# Patient Record
Sex: Male | Born: 1990 | Race: Black or African American | Hispanic: No | Marital: Single | Smoking: Current every day smoker
Health system: Southern US, Community
[De-identification: ages and names within clinical notes are randomized; demographics above are authoritative.]

## PROBLEM LIST (undated history)

## (undated) DIAGNOSIS — Z9889 Other specified postprocedural states: Secondary | ICD-10-CM

## (undated) DIAGNOSIS — R112 Nausea with vomiting, unspecified: Secondary | ICD-10-CM

## (undated) DIAGNOSIS — K219 Gastro-esophageal reflux disease without esophagitis: Secondary | ICD-10-CM

## (undated) HISTORY — PX: OTHER SURGICAL HISTORY: SHX169

---
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
## 2011-09-29 LAB — GC/CHLAMYDIA PROBE AMP, GENITAL: GC Probe Amp, Genital: NEGATIVE

## 2017-01-14 ENCOUNTER — Encounter (HOSPITAL_COMMUNITY): Payer: Self-pay

## 2017-01-14 ENCOUNTER — Emergency Department (HOSPITAL_COMMUNITY)
Admission: EM | Admit: 2017-01-14 | Discharge: 2017-01-15 | Disposition: A | Discharge status: Home / Self Care | Payer: Self-pay | Attending: Emergency Medicine | Admitting: Emergency Medicine

## 2017-01-14 DIAGNOSIS — Z79899 Other long term (current) drug therapy: Secondary | ICD-10-CM | POA: Insufficient documentation

## 2017-01-14 DIAGNOSIS — R112 Nausea with vomiting, unspecified: Secondary | ICD-10-CM | POA: Insufficient documentation

## 2017-01-14 DIAGNOSIS — R197 Diarrhea, unspecified: Secondary | ICD-10-CM | POA: Insufficient documentation

## 2017-01-14 DIAGNOSIS — F17200 Nicotine dependence, unspecified, uncomplicated: Secondary | ICD-10-CM | POA: Insufficient documentation

## 2017-01-14 LAB — CBC
HCT: 45.6 % (ref 39.0–52.0)
Hemoglobin: 15.1 g/dL (ref 13.0–17.0)
MCH: 28.2 pg (ref 26.0–34.0)
MCHC: 33.1 g/dL (ref 30.0–36.0)
MCV: 85.2 fL (ref 78.0–100.0)
PLATELETS: 259 10*3/uL (ref 150–400)
RBC: 5.35 MIL/uL (ref 4.22–5.81)
RDW: 13.1 % (ref 11.5–15.5)
WBC: 6.8 10*3/uL (ref 4.0–10.5)

## 2017-01-14 LAB — COMPREHENSIVE METABOLIC PANEL
ALBUMIN: 3.9 g/dL (ref 3.5–5.0)
ALK PHOS: 62 U/L (ref 38–126)
ALT: 31 U/L (ref 17–63)
AST: 25 U/L (ref 15–41)
Anion gap: 11 (ref 5–15)
BUN: 12 mg/dL (ref 6–20)
CALCIUM: 8.9 mg/dL (ref 8.9–10.3)
CHLORIDE: 105 mmol/L (ref 101–111)
CO2: 27 mmol/L (ref 22–32)
CREATININE: 1.13 mg/dL (ref 0.61–1.24)
GFR calc Af Amer: >60 mL/min (ref >60)
GFR calc non Af Amer: >60 mL/min (ref >60)
GLUCOSE: 117 mg/dL — AB (ref 65–99)
Potassium: 3.9 mmol/L (ref 3.5–5.1)
SODIUM: 143 mmol/L (ref 135–145)
Total Bilirubin: 0.5 mg/dL (ref 0.3–1.2)
Total Protein: 7.3 g/dL (ref 6.5–8.1)

## 2017-01-14 LAB — URINALYSIS, ROUTINE W REFLEX MICROSCOPIC
Bilirubin Urine: NEGATIVE
GLUCOSE, UA: NEGATIVE mg/dL
Hgb urine dipstick: NEGATIVE
KETONES UR: NEGATIVE mg/dL
Nitrite: NEGATIVE
PH: 5 (ref 5.0–8.0)
Protein, ur: NEGATIVE mg/dL
Specific Gravity, Urine: 1.027 (ref 1.005–1.030)

## 2017-01-14 LAB — LIPASE, BLOOD: LIPASE: 18 U/L (ref 11–51)

## 2017-01-14 MED ORDER — ONDANSETRON 4 MG PO TBDP
ORAL_TABLET | ORAL | Status: AC
  Filled 2017-01-14: qty 1

## 2017-01-14 MED ORDER — ONDANSETRON 4 MG PO TBDP
4.0000 mg | ORAL_TABLET | Freq: Once | ORAL | Status: AC
  Administered 2017-01-14: 4 mg via ORAL

## 2017-01-14 MED ORDER — LOPERAMIDE HCL 2 MG PO CAPS
4.0000 mg | ORAL_CAPSULE | Freq: Once | ORAL | Status: AC
  Administered 2017-01-15: 4 mg via ORAL
  Filled 2017-01-14: qty 2

## 2017-01-14 MED ORDER — SODIUM CHLORIDE 0.9 % IV BOLUS (SEPSIS)
1000.0000 mL | Freq: Once | INTRAVENOUS | Status: AC
  Administered 2017-01-15: 1000 mL via INTRAVENOUS

## 2017-01-14 MED ORDER — ONDANSETRON HCL 4 MG/2ML IJ SOLN
4.0000 mg | Freq: Once | INTRAMUSCULAR | Status: AC
  Administered 2017-01-15: 4 mg via INTRAVENOUS
  Filled 2017-01-14: qty 2

## 2017-01-14 NOTE — ED Provider Notes (Signed)
[facility]EM[facility]Y DEPT Provider Note   CSN: [id number] Arrival date & time: 01/14/17  2036 By signing my name below, I, Bridgette Habermann, attest that this documentation has been prepared under the direction and in the presence of Shon Baton, MD. Electronically Signed: Bridgette Habermann, ED Scribe. 01/14/17. 11:49 PM.  History   Chief Complaint Chief Complaint  Patient presents with  . Cough  . Emesis  . Abdominal Pain    HPI The history is provided by the patient. No language interpreter was used.   HPI Comments: Derrick Shields is a 26 y.o. male with no pertinent PMHx, who presents to the Emergency Department complaining of abdominal pain onset this morning with associated vomiting, nausea, cough, and generalized arthralgias/myalgias. Pt has taken Pepto Bismol with no relief. No known sick contacts with similar symptoms. NKDA. Pt denies fever, chills, hematochezia, dysuria, hematuria, or any other associated symptoms.   History reviewed. No pertinent past medical history.  There are no active problems to display for this patient.   History reviewed. No pertinent surgical history.     Home Medications    Prior to Admission medications   Medication Sig Start Date End Date Taking? Authorizing Provider  loperamide (IMODIUM) 2 MG capsule Take 1 capsule (2 mg total) by mouth 4 (four) times daily as needed for diarrhea or loose stools. 01/15/17   Shon Baton, MD  ondansetron (ZOFRAN ODT) 4 MG disintegrating tablet Take 1 tablet (4 mg total) by mouth every 8 (eight) hours as needed for nausea or vomiting. 01/15/17   Shon Baton, MD    Family History History reviewed. No pertinent family history.  Social History Social History  Substance Use Topics  . Smoking status: Current Every Day Smoker  . Smokeless tobacco: Never Used  . Alcohol use Yes     Allergies   Patient has no known allergies.   Review of Systems Review of Systems  Constitutional: Negative for chills and  fever.  Respiratory: Positive for cough. Negative for shortness of breath.   Cardiovascular: Negative for chest pain.  Gastrointestinal: Positive for abdominal pain, nausea and vomiting. Negative for blood in stool.  Genitourinary: Negative for dysuria and hematuria.  Musculoskeletal: Positive for arthralgias and myalgias.  All other systems reviewed and are negative.    Physical Exam Updated Vital Signs BP (!) 107/52   Pulse 108   Temp 99.8 F (37.7 C) (Oral)   Resp 17   Ht 5\' 6"  (1.676 m)   Wt 230 lb (104.3 kg)   SpO2 94%   BMI 37.12 kg/m   Physical Exam  Constitutional: He is oriented to person, place, and time. He appears well-developed and well-nourished.  HENT:  Head: Normocephalic and atraumatic.  Cardiovascular: Normal rate, regular rhythm and normal heart sounds.   No murmur heard. Pulmonary/Chest: Effort normal and breath sounds normal. No respiratory distress. He has no wheezes.  Abdominal: Soft. There is no tenderness. There is no rebound.  Hyperactive BS  Musculoskeletal: He exhibits no edema.  Neurological: He is alert and oriented to person, place, and time.  Skin: Skin is warm and dry.  Multiple tattoos  Psychiatric: He has a normal mood and affect.  Nursing note and vitals reviewed.    ED Treatments / Results  DIAGNOSTIC STUDIES: Oxygen Saturation is 99% on RA, normal by my interpretation.    COORDINATION OF CARE: 11:48 PM Discussed treatment plan with pt at bedside which includes IV fluids and pt agreed to plan.  Labs (all  labs ordered are listed, but only abnormal results are displayed) Labs Reviewed  COMPREHENSIVE METABOLIC PANEL - Abnormal; Notable for the following:       Result Value   Glucose, Bld 117 (*)    All other components within normal limits  URINALYSIS, ROUTINE W REFLEX MICROSCOPIC - Abnormal; Notable for the following:    APPearance HAZY (*)    Leukocytes, UA MODERATE (*)    Bacteria, UA RARE (*)    Squamous Epithelial / LPF  0-5 (*)    All other components within normal limits  LIPASE, BLOOD  CBC    EKG  EKG Interpretation None       Radiology No results found.  Procedures Procedures (including critical care time)  Medications Ordered in ED Medications  ondansetron (ZOFRAN-ODT) disintegrating tablet 4 mg (4 mg Oral Given 01/14/17 2049)  sodium chloride 0.9 % bolus 1,000 mL (0 mLs Intravenous Stopped 01/15/17 0148)  ondansetron (ZOFRAN) injection 4 mg (4 mg Intravenous Given 01/15/17 0018)  loperamide (IMODIUM) capsule 4 mg (4 mg Oral Given 01/15/17 0018)  ketorolac (TORADOL) 15 MG/ML injection 15 mg (15 mg Intravenous Given 01/15/17 0058)     Initial Impression / Assessment and Plan / ED Course  I have reviewed the triage vital signs and the nursing notes.  Pertinent labs & imaging results that were available during my care of the patient were reviewed by me and considered in my medical decision making (see chart for details).  Patient presents with nausea, vomiting, diarrhea. Nontoxic. Mildly tachycardic but otherwise vital signs reassuring. Nontender on exam. He does have hyperactive bowel sounds. Basic labwork obtained and reassuring. No ketones in the urine. Patient given fluids, Zofran, Imodium. He is tolerating by mouth. He is otherwise well-appearing. Suspect viral etiology. Symptom control at home including Zofran and Imodium.  After history, exam, and medical workup I feel the patient has been appropriately medically screened and is safe for discharge home. Pertinent diagnoses were discussed with the patient. Patient was given return precautions.   Final Clinical Impressions(s) / ED Diagnoses   Final diagnoses:  Nausea vomiting and diarrhea    New Prescriptions Discharge Medication List as of 01/15/2017  2:14 AM    START taking these medications   Details  loperamide (IMODIUM) 2 MG capsule Take 1 capsule (2 mg total) by mouth 4 (four) times daily as needed for diarrhea or loose  stools., Starting Mon 01/15/2017, Print    ondansetron (ZOFRAN ODT) 4 MG disintegrating tablet Take 1 tablet (4 mg total) by mouth every 8 (eight) hours as needed for nausea or vomiting., Starting Mon 01/15/2017, Print       I personally performed the services described in this documentation, which was scribed in my presence. The recorded information has been reviewed and is accurate.     Shon Batonourtney F Kendyll Huettner, MD 01/15/17 [id number]

## 2017-01-14 NOTE — ED Triage Notes (Signed)
Pt complaining of nausea and vomiting since this morning. Pt complaining of cough and generalized body aches. Pt denies any fevers. Pt tachy at triage.

## 2017-01-15 MED ORDER — ONDANSETRON 4 MG PO TBDP
4.0000 mg | ORAL_TABLET | Freq: Three times a day (TID) | ORAL | 0 refills | Status: AC | PRN
Start: 2017-01-15 — End: ?

## 2017-01-15 MED ORDER — KETOROLAC TROMETHAMINE 15 MG/ML IJ SOLN
15.0000 mg | Freq: Once | INTRAMUSCULAR | Status: AC
  Administered 2017-01-15: 15 mg via INTRAVENOUS
  Filled 2017-01-15: qty 1

## 2017-01-15 MED ORDER — LOPERAMIDE HCL 2 MG PO CAPS: 2.0000 mg | ORAL_CAPSULE | Freq: Four times a day (QID) | ORAL | 0 refills | Status: AC | PRN

## 2017-01-15 NOTE — ED Notes (Signed)
Pt tolerated po fluids well.   

## 2017-01-15 NOTE — ED Notes (Signed)
Pt drinking gingerale at this time

## 2017-01-21 ENCOUNTER — Emergency Department (HOSPITAL_COMMUNITY): Payer: Self-pay

## 2017-01-21 ENCOUNTER — Encounter (HOSPITAL_COMMUNITY): Payer: Self-pay | Admitting: Emergency Medicine

## 2017-01-21 ENCOUNTER — Emergency Department (HOSPITAL_COMMUNITY)
Admission: EM | Admit: 2017-01-21 | Discharge: 2017-01-22 | Disposition: A | Discharge status: Home / Self Care | Payer: Self-pay | Attending: Emergency Medicine | Admitting: Emergency Medicine

## 2017-01-21 DIAGNOSIS — Z79899 Other long term (current) drug therapy: Secondary | ICD-10-CM | POA: Insufficient documentation

## 2017-01-21 DIAGNOSIS — F17200 Nicotine dependence, unspecified, uncomplicated: Secondary | ICD-10-CM | POA: Insufficient documentation

## 2017-01-21 DIAGNOSIS — R0789 Other chest pain: Secondary | ICD-10-CM | POA: Insufficient documentation

## 2017-01-21 DIAGNOSIS — J020 Streptococcal pharyngitis: Secondary | ICD-10-CM | POA: Insufficient documentation

## 2017-01-21 LAB — CBC
HEMATOCRIT: 39.7 % (ref 39.0–52.0)
Hemoglobin: 13.1 g/dL (ref 13.0–17.0)
MCH: 27.5 pg (ref 26.0–34.0)
MCHC: 33 g/dL (ref 30.0–36.0)
MCV: 83.4 fL (ref 78.0–100.0)
PLATELETS: 262 10*3/uL (ref 150–400)
RBC: 4.76 MIL/uL (ref 4.22–5.81)
RDW: 12.8 % (ref 11.5–15.5)
WBC: 16.1 10*3/uL — ABNORMAL HIGH (ref 4.0–10.5)

## 2017-01-21 MED ORDER — ACETAMINOPHEN 500 MG PO TABS
1000.0000 mg | ORAL_TABLET | Freq: Once | ORAL | Status: AC
  Administered 2017-01-21: 1000 mg via ORAL

## 2017-01-21 MED ORDER — ACETAMINOPHEN 500 MG PO TABS
ORAL_TABLET | ORAL | Status: AC
  Filled 2017-01-21: qty 2

## 2017-01-21 MED ORDER — SODIUM CHLORIDE 0.9 % IV BOLUS (SEPSIS)
1000.0000 mL | Freq: Once | INTRAVENOUS | Status: AC
  Administered 2017-01-22: 1000 mL via INTRAVENOUS

## 2017-01-21 NOTE — ED Triage Notes (Signed)
Patient with fever and chest pain that started earlier today.  Patient states that he has been sick with nausea and vomiting for that last few days.  His chest started hurting today while he was sleeping.  Patient does have some shortness of breath when walking.

## 2017-01-22 LAB — I-STAT TROPONIN, ED: TROPONIN I, POC: 0 ng/mL (ref 0.00–0.08)

## 2017-01-22 LAB — RAPID STREP SCREEN (MED CTR MEBANE ONLY): STREPTOCOCCUS, GROUP A SCREEN (DIRECT): POSITIVE — AB

## 2017-01-22 LAB — BASIC METABOLIC PANEL
ANION GAP: 11 (ref 5–15)
BUN: 7 mg/dL (ref 6–20)
CALCIUM: 8.4 mg/dL — AB (ref 8.9–10.3)
CHLORIDE: 100 mmol/L — AB (ref 101–111)
CO2: 21 mmol/L — AB (ref 22–32)
CREATININE: 0.99 mg/dL (ref 0.61–1.24)
GFR calc non Af Amer: >60 mL/min (ref >60)
Glucose, Bld: 148 mg/dL — ABNORMAL HIGH (ref 65–99)
Potassium: 3.7 mmol/L (ref 3.5–5.1)
SODIUM: 132 mmol/L — AB (ref 135–145)

## 2017-01-22 MED ORDER — PENICILLIN G BENZATHINE 1200000 UNIT/2ML IM SUSP
1.2000 10*6.[IU] | Freq: Once | INTRAMUSCULAR | Status: AC
  Administered 2017-01-22: 1.2 10*6.[IU] via INTRAMUSCULAR
  Filled 2017-01-22: qty 2

## 2017-01-22 MED ORDER — IBUPROFEN 400 MG PO TABS
600.0000 mg | ORAL_TABLET | Freq: Once | ORAL | Status: AC
  Administered 2017-01-22: 600 mg via ORAL
  Filled 2017-01-22: qty 1

## 2017-01-22 MED ORDER — DEXAMETHASONE SODIUM PHOSPHATE 4 MG/ML IJ SOLN
10.0000 mg | Freq: Once | INTRAMUSCULAR | Status: AC
  Administered 2017-01-22: 10 mg via INTRAVENOUS
  Filled 2017-01-22: qty 3

## 2017-01-22 MED ORDER — IBUPROFEN 600 MG PO TABS: 600.0000 mg | ORAL_TABLET | Freq: Four times a day (QID) | ORAL | 0 refills | Status: DC | PRN

## 2017-01-22 MED ORDER — SODIUM CHLORIDE 0.9 % IV BOLUS (SEPSIS)
1000.0000 mL | Freq: Once | INTRAVENOUS | Status: AC
  Administered 2017-01-22: 1000 mL via INTRAVENOUS

## 2017-01-22 NOTE — Discharge Instructions (Signed)
You were seen today for fever, chest pain, sore throat. You have strep throat. He will be given antibiotics and steroids for your sore throat. You need to make sure that you are drinking plenty of fluids. Take ibuprofen as needed for pain or fever.  If you have any new or worsening symptoms you should be reevaluated.

## 2017-01-22 NOTE — ED Notes (Signed)
ED Provider at bedside. 

## 2017-01-22 NOTE — ED Provider Notes (Signed)
[facility] Provider Note   CSN: [id number] Arrival date & time: 01/21/17  2214     History   Chief Complaint Chief Complaint  Patient presents with  . Chest Pain  . Fever    HPI Derrick Shields is a 26 y.o. male.  HPI  This is a 26 year old male who presents with chest pain, sore throat, chills, myalgias. Patient was seen and evaluated by myself one week ago for nausea, vomiting, diarrhea. He states that he got over that illness but today has developed left sharp chest pain. It is worse with breathing. He reports cough and sore throat. He did not take his temperature at home but reports chills and myalgias. Temperature here 103.1. He has not taken anything for his symptoms. States that the chest pain is not affected with exertion or position changes. No known sick contacts. He did not get his flu shot this year.  History reviewed. No pertinent past medical history.  There are no active problems to display for this patient.   History reviewed. No pertinent surgical history.     Home Medications    Prior to Admission medications   Medication Sig Start Date End Date Taking? Authorizing Provider  ibuprofen (ADVIL,MOTRIN) 600 MG tablet Take 1 tablet (600 mg total) by mouth every 6 (six) hours as needed. 01/22/17   Shon Batonourtney F Armaan Pond, MD  loperamide (IMODIUM) 2 MG capsule Take 1 capsule (2 mg total) by mouth 4 (four) times daily as needed for diarrhea or loose stools. Patient not taking: Reported on 01/22/2017 01/15/17   Shon Batonourtney F Jann Ra, MD  ondansetron (ZOFRAN ODT) 4 MG disintegrating tablet Take 1 tablet (4 mg total) by mouth every 8 (eight) hours as needed for nausea or vomiting. Patient not taking: Reported on 01/22/2017 01/15/17   Shon Batonourtney F Ellary Casamento, MD    Family History No family history on file.  Social History Social History  Substance Use Topics  . Smoking status: Current Every Day Smoker  . Smokeless tobacco: Never Used  . Alcohol use Yes     Allergies     Patient has no known allergies.   Review of Systems Review of Systems  Constitutional: Positive for chills and fever.  Respiratory: Positive for cough and shortness of breath.   Cardiovascular: Positive for chest pain.  Gastrointestinal: Negative for abdominal pain, diarrhea, nausea and vomiting.  Musculoskeletal: Positive for myalgias.  All other systems reviewed and are negative.    Physical Exam Updated Vital Signs BP 113/65 (BP Location: Right Arm)   Pulse 87   Temp (!) 103.1 F (39.5 C) (Oral)   Resp 20   Ht 5\' 6"  (1.676 m)   Wt 230 lb (104.3 kg)   SpO2 97%   BMI 37.12 kg/m   Physical Exam  Constitutional: He is oriented to person, place, and time. He appears well-developed and well-nourished.  Ill-appearing but nontoxic  HENT:  Head: Normocephalic and atraumatic.  Bilateral symmetric tonsillar enlargement, no exudate, uvula midline  Neck: Normal range of motion.  No meningismus  Cardiovascular: Regular rhythm and normal heart sounds.   No murmur heard. Tachycardia  Pulmonary/Chest: Effort normal and breath sounds normal. No respiratory distress. He has no wheezes. He exhibits tenderness.  Abdominal: Soft. Bowel sounds are normal. There is no tenderness. There is no rebound.  Musculoskeletal: He exhibits no edema.  Lymphadenopathy:    He has cervical adenopathy.  Neurological: He is alert and oriented to person, place, and time.  Skin: Skin is warm. He  is diaphoretic.  Psychiatric: He has a normal mood and affect.  Nursing note and vitals reviewed.    ED Treatments / Results  Labs (all labs ordered are listed, but only abnormal results are displayed) Labs Reviewed  RAPID STREP SCREEN (NOT AT [facility]) - Abnormal; Notable for the following:       Result Value   Streptococcus, Group A Screen (Direct) POSITIVE (*)    All other components within normal limits  BASIC METABOLIC PANEL - Abnormal; Notable for the following:    Sodium 132 (*)    Chloride 100 (*)     CO2 21 (*)    Glucose, Bld 148 (*)    Calcium 8.4 (*)    All other components within normal limits  CBC - Abnormal; Notable for the following:    WBC 16.1 (*)    All other components within normal limits  I-STAT TROPOININ, ED    EKG  EKG Interpretation  Date/Time:  Sunday January 21 2017 23:11:25 EST Ventricular Rate:  119 PR Interval:  134 QRS Duration: 78 QT Interval:  276 QTC Calculation: 388 R Axis:   78 Text Interpretation:  Sinus tachycardia T wave abnormality, consider inferior ischemia Abnormal ECG Confirmed by Wilkie Aye  MD, Cyndee Giammarco ([id number]) on 01/21/2017 11:55:28 PM       Radiology Dg Chest 2 View  Result Date: 01/21/2017 CLINICAL DATA:  Left-sided chest pain, onset today. Productive cough for 2 days. Febrile today. EXAM: CHEST  2 VIEW COMPARISON:  None. FINDINGS: The heart size and mediastinal contours are within normal limits. Both lungs are clear. The visualized skeletal structures are unremarkable. IMPRESSION: No active cardiopulmonary disease. Electronically Signed   By: Ellery Plunk M.D.   On: 01/21/2017 23:40    Procedures Procedures (including critical care time)  Medications Ordered in ED Medications  acetaminophen (TYLENOL) 500 MG tablet (not administered)  acetaminophen (TYLENOL) tablet 1,000 mg (1,000 mg Oral Given 01/21/17 2309)  sodium chloride 0.9 % bolus 1,000 mL (0 mLs Intravenous Stopped 01/22/17 0140)  ibuprofen (ADVIL,MOTRIN) tablet 600 mg (600 mg Oral Given 01/22/17 0056)  dexamethasone (DECADRON) injection 10 mg (10 mg Intravenous Given 01/22/17 0229)  penicillin g benzathine (BICILLIN LA) 1200000 UNIT/2ML injection 1.2 Million Units (1.2 Million Units Intramuscular Given 01/22/17 0236)  sodium chloride 0.9 % bolus 1,000 mL (1,000 mLs Intravenous New Bag/Given 01/22/17 0329)     Initial Impression / Assessment and Plan / ED Course  I have reviewed the triage vital signs and the nursing notes.  Pertinent labs & imaging results that were  available during my care of the patient were reviewed by me and considered in my medical decision making (see chart for details).     Patient presents with sore throat, myalgias, fever, chest pain. Ill-appearing but nontoxic. Temperature 103. Lab work obtained. Patient given fluids and pain medication. Given Tylenol and ibuprofen for fever. White count of 16. Strep screen is positive. He has symmetric tonsillar swelling. No exudate. Patient was given Decadron and Bicillin. He was given a total of 2 L of fluid. Chest x-ray shows no evidence of pneumonia. On recheck, he is improved. Fever likely driving myalgias.  His chest pain is reproducible and EKG and troponin are reassuring. Recommend supportive measures at home including aggressive hydration and ibuprofen as needed for pain or fevers.  After history, exam, and medical workup I feel the patient has been appropriately medically screened and is safe for discharge home. Pertinent diagnoses were discussed with the patient. Patient was given  return precautions.   Final Clinical Impressions(s) / ED Diagnoses   Final diagnoses:  Strep throat  Atypical chest pain    New Prescriptions New Prescriptions   IBUPROFEN (ADVIL,MOTRIN) 600 MG TABLET    Take 1 tablet (600 mg total) by mouth every 6 (six) hours as needed.     Shon Baton, MD 01/22/17 [phone]

## 2017-04-30 ENCOUNTER — Encounter (HOSPITAL_COMMUNITY): Payer: Self-pay

## 2017-04-30 ENCOUNTER — Emergency Department (HOSPITAL_COMMUNITY)
Admission: EM | Admit: 2017-04-30 | Discharge: 2017-04-30 | Disposition: A | Discharge status: Home / Self Care | Payer: No Typology Code available for payment source | Attending: Emergency Medicine | Admitting: Emergency Medicine

## 2017-04-30 ENCOUNTER — Emergency Department (HOSPITAL_COMMUNITY): Payer: No Typology Code available for payment source

## 2017-04-30 DIAGNOSIS — M542 Cervicalgia: Secondary | ICD-10-CM

## 2017-04-30 DIAGNOSIS — Y939 Activity, unspecified: Secondary | ICD-10-CM | POA: Diagnosis not present

## 2017-04-30 DIAGNOSIS — F17200 Nicotine dependence, unspecified, uncomplicated: Secondary | ICD-10-CM | POA: Insufficient documentation

## 2017-04-30 DIAGNOSIS — Y92410 Unspecified street and highway as the place of occurrence of the external cause: Secondary | ICD-10-CM | POA: Insufficient documentation

## 2017-04-30 DIAGNOSIS — S199XXA Unspecified injury of neck, initial encounter: Secondary | ICD-10-CM | POA: Diagnosis present

## 2017-04-30 DIAGNOSIS — Y999 Unspecified external cause status: Secondary | ICD-10-CM | POA: Diagnosis not present

## 2017-04-30 MED ORDER — IBUPROFEN 600 MG PO TABS: 600.0000 mg | ORAL_TABLET | Freq: Three times a day (TID) | ORAL | 0 refills | Status: AC | PRN

## 2017-04-30 MED ORDER — METHOCARBAMOL 500 MG PO TABS: 500.0000 mg | ORAL_TABLET | Freq: Two times a day (BID) | ORAL | 0 refills | Status: AC

## 2017-04-30 NOTE — ED Notes (Signed)
See provider assessment 
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
## 2017-09-28 ENCOUNTER — Emergency Department (HOSPITAL_COMMUNITY)
Admission: EM | Admit: 2017-09-28 | Discharge: 2017-09-28 | Disposition: A | Discharge status: Home / Self Care | Payer: Self-pay | Attending: Emergency Medicine | Admitting: Emergency Medicine

## 2017-09-28 ENCOUNTER — Encounter (HOSPITAL_COMMUNITY): Payer: Self-pay | Admitting: Neurology

## 2017-09-28 DIAGNOSIS — R197 Diarrhea, unspecified: Secondary | ICD-10-CM | POA: Insufficient documentation

## 2017-09-28 DIAGNOSIS — R070 Pain in throat: Secondary | ICD-10-CM | POA: Insufficient documentation

## 2017-09-28 DIAGNOSIS — R112 Nausea with vomiting, unspecified: Secondary | ICD-10-CM | POA: Insufficient documentation

## 2017-09-28 DIAGNOSIS — A084 Viral intestinal infection, unspecified: Secondary | ICD-10-CM | POA: Insufficient documentation

## 2017-09-28 DIAGNOSIS — Z720 Tobacco use: Secondary | ICD-10-CM

## 2017-09-28 DIAGNOSIS — J069 Acute upper respiratory infection, unspecified: Secondary | ICD-10-CM | POA: Insufficient documentation

## 2017-09-28 DIAGNOSIS — H66001 Acute suppurative otitis media without spontaneous rupture of ear drum, right ear: Secondary | ICD-10-CM | POA: Insufficient documentation

## 2017-09-28 DIAGNOSIS — R05 Cough: Secondary | ICD-10-CM | POA: Insufficient documentation

## 2017-09-28 DIAGNOSIS — F17200 Nicotine dependence, unspecified, uncomplicated: Secondary | ICD-10-CM | POA: Insufficient documentation

## 2017-09-28 DIAGNOSIS — H9201 Otalgia, right ear: Secondary | ICD-10-CM | POA: Insufficient documentation

## 2017-09-28 LAB — URINALYSIS, ROUTINE W REFLEX MICROSCOPIC
GLUCOSE, UA: NEGATIVE mg/dL
Hgb urine dipstick: NEGATIVE
Ketones, ur: NEGATIVE mg/dL
LEUKOCYTES UA: NEGATIVE
Nitrite: NEGATIVE
PH: 5 (ref 5.0–8.0)
Protein, ur: NEGATIVE mg/dL
Specific Gravity, Urine: 1.032 — ABNORMAL HIGH (ref 1.005–1.030)

## 2017-09-28 LAB — COMPREHENSIVE METABOLIC PANEL
ALT: 20 U/L (ref 17–63)
AST: 21 U/L (ref 15–41)
Albumin: 3.5 g/dL (ref 3.5–5.0)
Alkaline Phosphatase: 89 U/L (ref 38–126)
Anion gap: 7 (ref 5–15)
BUN: 7 mg/dL (ref 6–20)
CHLORIDE: 105 mmol/L (ref 101–111)
CO2: 24 mmol/L (ref 22–32)
Calcium: 8.7 mg/dL — ABNORMAL LOW (ref 8.9–10.3)
Creatinine, Ser: 0.86 mg/dL (ref 0.61–1.24)
Glucose, Bld: 107 mg/dL — ABNORMAL HIGH (ref 65–99)
POTASSIUM: 4 mmol/L (ref 3.5–5.1)
SODIUM: 136 mmol/L (ref 135–145)
Total Bilirubin: 0.3 mg/dL (ref 0.3–1.2)
Total Protein: 6.7 g/dL (ref 6.5–8.1)

## 2017-09-28 LAB — CBC
HEMATOCRIT: 41.3 % (ref 39.0–52.0)
Hemoglobin: 13.6 g/dL (ref 13.0–17.0)
MCH: 27.5 pg (ref 26.0–34.0)
MCHC: 32.9 g/dL (ref 30.0–36.0)
MCV: 83.4 fL (ref 78.0–100.0)
Platelets: 309 10*3/uL (ref 150–400)
RBC: 4.95 MIL/uL (ref 4.22–5.81)
RDW: 13.1 % (ref 11.5–15.5)
WBC: 9.5 10*3/uL (ref 4.0–10.5)

## 2017-09-28 LAB — LIPASE, BLOOD: LIPASE: 32 U/L (ref 11–51)

## 2017-09-28 MED ORDER — AZITHROMYCIN 250 MG PO TABS: ORAL_TABLET | ORAL | 0 refills | Status: AC

## 2017-09-28 MED ORDER — SODIUM CHLORIDE 0.9 % IV BOLUS (SEPSIS)
2000.0000 mL | Freq: Once | INTRAVENOUS | Status: AC
  Administered 2017-09-28: 2000 mL via INTRAVENOUS

## 2017-09-28 MED ORDER — KETOROLAC TROMETHAMINE 30 MG/ML IJ SOLN
30.0000 mg | Freq: Once | INTRAMUSCULAR | Status: AC
  Administered 2017-09-28: 30 mg via INTRAVENOUS
  Filled 2017-09-28: qty 1

## 2017-09-28 MED ORDER — ONDANSETRON 4 MG PO TBDP: 4.0000 mg | ORAL_TABLET | Freq: Three times a day (TID) | ORAL | 0 refills | Status: AC | PRN

## 2017-09-28 MED ORDER — ONDANSETRON 4 MG PO TBDP
4.0000 mg | ORAL_TABLET | Freq: Once | ORAL | Status: AC | PRN
  Administered 2017-09-28: 4 mg via ORAL

## 2017-09-28 MED ORDER — ONDANSETRON 4 MG PO TBDP
ORAL_TABLET | ORAL | Status: AC
  Filled 2017-09-28: qty 1

## 2017-09-28 NOTE — ED Triage Notes (Signed)
Pt here with n/v/d since yesterday. Woke up with right ear pain. Is a x 4. Wearing mask. In NAD

## 2017-09-28 NOTE — ED Notes (Signed)
Pt able to drink two cups of ginger ale without problem

## 2017-09-28 NOTE — ED Provider Notes (Signed)
[facility]-EMERGENCY DEPT Provider Note   CSN: [id number] Arrival date & time: 09/28/17  4098     History   Chief Complaint Chief Complaint  Patient presents with  . Emesis  . Diarrhea  . Otalgia    HPI Derrick Shields is a 26 y.o. male who presents to the ED with complaints of 2 separate complaints. His primary complaint is one day of nausea vomiting and diarrhea. His secondary complaint is one week of URI symptoms including cough with yellow sputum production, right ear pain, sore throat, and rhinorrhea. He states he has had 7 episodes of nonbloody nonbilious emesis since yesterday, and about 3-4 nonbloody watery diarrhea episodes since yesterday. He has tried Mucinex for his URI symptoms with no relief, no other treatments tried prior to arrival, but was given Zofran upon arrival and this helped his nausea and vomiting. No known and rating factors for any of his symptoms. He admits to being a cigarette smoker. He denies any fevers, chills, ear drainage, drooling, trismus, CP, SOB, abdominal pain, hematemesis, melena, hematochezia, constipation, obstipation, dysuria, hematuria, myalgias, arthralgias, numbness, tingling, focal weakness, or any other complaints at this time. He denies any recent travel, sick contacts, suspicious food intake, alcohol use, NSAID use, or recent abx use.    The history is provided by the patient and medical records. No language interpreter was used.  Emesis   This is a new problem. The current episode started yesterday. The problem occurs 5 to 10 times per day. The problem has not changed since onset.The emesis has an appearance of stomach contents. There has been no fever. Associated symptoms include cough and diarrhea. Pertinent negatives include no abdominal pain, no arthralgias, no chills, no fever and no myalgias.  Diarrhea   Associated symptoms include vomiting and cough. Pertinent negatives include no abdominal pain, no chills, no arthralgias and no myalgias.    Otalgia  Associated symptoms include rhinorrhea, sore throat, diarrhea, vomiting and cough. Pertinent negatives include no ear discharge and no abdominal pain.    History reviewed. No pertinent past medical history.  There are no active problems to display for this patient.   History reviewed. No pertinent surgical history.     Home Medications    Prior to Admission medications   Medication Sig Start Date End Date Taking? Authorizing Provider  ibuprofen (ADVIL,MOTRIN) 600 MG tablet Take 1 tablet (600 mg total) by mouth every 8 (eight) hours as needed. 04/30/17   Felicie Morn, NP  loperamide (IMODIUM) 2 MG capsule Take 1 capsule (2 mg total) by mouth 4 (four) times daily as needed for diarrhea or loose stools. Patient not taking: Reported on 01/22/2017 01/15/17   Horton, Mayer Masker, MD  methocarbamol (ROBAXIN) 500 MG tablet Take 1 tablet (500 mg total) by mouth 2 (two) times daily. 04/30/17   Felicie Morn, NP  ondansetron (ZOFRAN ODT) 4 MG disintegrating tablet Take 1 tablet (4 mg total) by mouth every 8 (eight) hours as needed for nausea or vomiting. Patient not taking: Reported on 01/22/2017 01/15/17   Horton, Mayer Masker, MD    Family History No family history on file.  Social History Social History  Substance Use Topics  . Smoking status: Current Every Day Smoker  . Smokeless tobacco: Never Used  . Alcohol use Yes     Allergies   Penicillins   Review of Systems Review of Systems  Constitutional: Negative for chills and fever.  HENT: Positive for ear pain, rhinorrhea and sore throat. Negative for drooling, ear  discharge and trouble swallowing.   Respiratory: Positive for cough. Negative for shortness of breath.   Cardiovascular: Negative for chest pain.  Gastrointestinal: Positive for diarrhea, nausea and vomiting. Negative for abdominal pain, blood in stool and constipation.  Genitourinary: Negative for dysuria and hematuria.  Musculoskeletal: Negative for arthralgias  and myalgias.  Skin: Negative for color change.  Allergic/Immunologic: Negative for immunocompromised state.  Neurological: Negative for weakness and numbness.  Psychiatric/Behavioral: Negative for confusion.   All other systems reviewed and are negative for acute change except as noted in the HPI.    Physical Exam Updated Vital Signs BP (!) 137/91 (BP Location: Right Arm)   Pulse 87   Temp 98.7 F (37.1 C) (Oral)   Resp 14   Ht  (1.676 m)   SpO2 96%   Physical Exam  Constitutional: He is oriented to person, place, and time. Vital signs are normal. He appears well-developed and well-nourished.  Non-toxic appearance. No distress.  Afebrile, nontoxic, NAD  HENT:  Head: Normocephalic and atraumatic.  Right Ear: Hearing, external ear and ear canal normal. Tympanic membrane is erythematous and bulging. A middle ear effusion is present.  Left Ear: Hearing, tympanic membrane, external ear and ear canal normal.  Nose: Mucosal edema and rhinorrhea present.  Mouth/Throat: Uvula is midline and mucous membranes are normal. No trismus in the jaw. No uvula swelling. Posterior oropharyngeal erythema present. No oropharyngeal exudate, posterior oropharyngeal edema or tonsillar abscesses. Tonsils are 1+ on the right. Tonsils are 1+ on the left. No tonsillar exudate.  L ear clear. R ear with clear canal, TM bulging and erythematous with suppurative effusion. Nose congested. Oropharynx mildly injected, without uvular swelling or deviation, no trismus or drooling, 1+ b/l tonsillar hypertrophy, no exudates.  No PTA.   Eyes: Conjunctivae and EOM are normal. Right eye exhibits no discharge. Left eye exhibits no discharge.  Neck: Normal range of motion. Neck supple.  Cardiovascular: Normal rate, regular rhythm, normal heart sounds and intact distal pulses.  Exam reveals no gallop and no friction rub.   No murmur heard. Pulmonary/Chest: Effort normal and breath sounds normal. No respiratory distress. He  has no decreased breath sounds. He has no wheezes. He has no rhonchi. He has no rales.  CTAB in all lung fields, no w/r/r, no hypoxia or increased WOB, speaking in full sentences, SpO2 96% on RA   Abdominal: Soft. Normal appearance and bowel sounds are normal. He exhibits no distension. There is no tenderness. There is no rigidity, no rebound, no guarding, no CVA tenderness, no tenderness at McBurney's point and negative Murphy's sign.  Soft, NTND, +BS throughout, no r/g/r, neg murphy's, neg mcburney's, no CVA TTP   Musculoskeletal: Normal range of motion.  Neurological: He is alert and oriented to person, place, and time. He has normal strength. No sensory deficit.  Skin: Skin is warm, dry and intact. No rash noted.  Psychiatric: He has a normal mood and affect.  Nursing note and vitals reviewed.    ED Treatments / Results  Labs (all labs ordered are listed, but only abnormal results are displayed) Labs Reviewed  COMPREHENSIVE METABOLIC PANEL - Abnormal; Notable for the following:       Result Value   Glucose, Bld 107 (*)    Calcium 8.7 (*)    All other components within normal limits  URINALYSIS, ROUTINE W REFLEX MICROSCOPIC - Abnormal; Notable for the following:    Specific Gravity, Urine 1.032 (*)    Bilirubin Urine SMALL (*)  All other components within normal limits  LIPASE, BLOOD  CBC    EKG  EKG Interpretation None       Radiology No results found.  Procedures Procedures (including critical care time)  Medications Ordered in ED Medications  ondansetron (ZOFRAN-ODT) 4 MG disintegrating tablet (not administered)  ondansetron (ZOFRAN-ODT) disintegrating tablet 4 mg (4 mg Oral Given 09/28/17 0855)  sodium chloride 0.9 % bolus 2,000 mL (2,000 mLs Intravenous New Bag/Given 09/28/17 1245)  ketorolac (TORADOL) 30 MG/ML injection 30 mg (30 mg Intravenous Given 09/28/17 1239)     Initial Impression / Assessment and Plan / ED Course  I have reviewed the triage vital  signs and the nursing notes.  Pertinent labs & imaging results that were available during my care of the patient were reviewed by me and considered in my medical decision making (see chart for details).     26 y.o. male here with 1wk of URI symptoms and R otalgia, and 1 day of n/v/d. On exam, R ear with bulging erythematous TM with suppurative effusion, mild oropharynx injection and hypertrophied tonsils bilaterally but no tonsillar exudates or PTA, clear lungs, abdomen benign, afebrile and nontoxic. Work up thus far: lipase, CBC, and CMP all WNL, awaiting U/A. Doubt need for CXR since treatment of AOM will treat pulmonary bugs, and given clear lung exam. Will give fluids, pt already given zofran which helped, will PO challenge and await U/A then reassess after. Will give toradol for pain as well.   4:06 PM U/A unremarkable. Pt feeling much better and tolerating PO well. Will send home with abx for his AOM (pt unsure of allergy to PCN, so will err on side of caution and use azithromycin); advised other OTC remedies for symptomatic relief, and smoking cessation advised. For his n/v/d, likely viral gastroenteritis; advised BRAT diet, will rx zofran, discussed other OTC remedies for symptomatic relief, and importance of staying hydrated. F/up with [facility] in 1wk for recheck and to establish medical care. I explained the diagnosis and have given explicit precautions to return to the ER including for any other new or worsening symptoms. The patient understands and accepts the medical plan as it's been dictated and I have answered their questions. Discharge instructions concerning home care and prescriptions have been given. The patient is STABLE and is discharged to home in good condition.   Final Clinical Impressions(s) / ED Diagnoses   Final diagnoses:  Nausea vomiting and diarrhea  Viral gastroenteritis  Acute suppurative otitis media of right ear without spontaneous rupture of tympanic membrane,  recurrence not specified  Upper respiratory tract infection, unspecified type  Tobacco user    New Prescriptions New Prescriptions   AZITHROMYCIN (ZITHROMAX Z-PAK) 250 MG TABLET    2 po day one, then 1 daily x 4 days   ONDANSETRON (ZOFRAN ODT) 4 MG DISINTEGRATING TABLET    Take 1 tablet (4 mg total) by mouth every 8 (eight) hours as needed for nausea or vomiting.     [address], [place], [place] 09/28/17 1606    Gwyneth Sprout, MD 09/29/17 2017

## 2017-09-28 NOTE — Discharge Instructions (Signed)
For your ear infection and upper respiratory infection symptoms: take antibiotic as directed until completed. Continue to stay well-hydrated. Gargle warm salt water and spit it out. Use chloraseptic spray as needed for sore throat. Continue to alternate between Tylenol and Ibuprofen for pain or fever. Use Mucinex for cough suppression/expectoration of mucus. Use netipot and flonase to help with nasal congestion. May consider over-the-counter Benadryl or other antihistamine to decrease secretions and for help with your symptoms. STOP SMOKING! Follow up with [facility] in 5-7 days for recheck of ongoing symptoms and to establish medical care. Return to emergency department for emergent changing or worsening of symptoms.  For your nausea/vomiting/diarrhea: Use zofran as prescribed, as needed for nausea. Alternate between tylenol and motrin as needed for pain. May consider using over the counter tums, maalox, pepto bismol, or other over the counter remedies to help with symptoms. Stay well hydrated with small sips of fluids throughout the day. Follow a BRAT (banana-rice-applesauce-toast) diet as described below for the next 24-48 hours. The 'BRAT' diet is suggested, then progress to diet as tolerated as symptoms abate. Call your regular doctor if bloody stools, persistent diarrhea, vomiting, fever or abdominal pain. Follow up with the wellness center as mentioned above. Return to ER for changing or worsening of symptoms.

## 2017-10-08 ENCOUNTER — Emergency Department (HOSPITAL_COMMUNITY): Payer: Self-pay

## 2017-10-08 ENCOUNTER — Emergency Department (HOSPITAL_COMMUNITY)
Admission: EM | Admit: 2017-10-08 | Discharge: 2017-10-08 | Disposition: A | Discharge status: Home / Self Care | Payer: Self-pay | Attending: Emergency Medicine | Admitting: Emergency Medicine

## 2017-10-08 ENCOUNTER — Encounter (HOSPITAL_COMMUNITY): Payer: Self-pay | Admitting: *Deleted

## 2017-10-08 DIAGNOSIS — R1011 Right upper quadrant pain: Secondary | ICD-10-CM | POA: Insufficient documentation

## 2017-10-08 DIAGNOSIS — F17200 Nicotine dependence, unspecified, uncomplicated: Secondary | ICD-10-CM | POA: Insufficient documentation

## 2017-10-08 DIAGNOSIS — R0781 Pleurodynia: Secondary | ICD-10-CM

## 2017-10-08 DIAGNOSIS — R0789 Other chest pain: Secondary | ICD-10-CM | POA: Insufficient documentation

## 2017-10-08 DIAGNOSIS — R071 Chest pain on breathing: Secondary | ICD-10-CM | POA: Insufficient documentation

## 2017-10-08 LAB — COMPREHENSIVE METABOLIC PANEL
ALBUMIN: 3.8 g/dL (ref 3.5–5.0)
ALK PHOS: 81 U/L (ref 38–126)
ALT: 29 U/L (ref 17–63)
ANION GAP: 8 (ref 5–15)
AST: 22 U/L (ref 15–41)
BILIRUBIN TOTAL: 0.3 mg/dL (ref 0.3–1.2)
BUN: 7 mg/dL (ref 6–20)
CALCIUM: 8.9 mg/dL (ref 8.9–10.3)
CO2: 25 mmol/L (ref 22–32)
Chloride: 103 mmol/L (ref 101–111)
Creatinine, Ser: 0.9 mg/dL (ref 0.61–1.24)
GFR calc Af Amer: >60 mL/min (ref >60)
GLUCOSE: 97 mg/dL (ref 65–99)
Potassium: 3.8 mmol/L (ref 3.5–5.1)
Sodium: 136 mmol/L (ref 135–145)
TOTAL PROTEIN: 7.4 g/dL (ref 6.5–8.1)

## 2017-10-08 LAB — URINALYSIS, ROUTINE W REFLEX MICROSCOPIC
BILIRUBIN URINE: NEGATIVE
GLUCOSE, UA: NEGATIVE mg/dL
Hgb urine dipstick: NEGATIVE
KETONES UR: NEGATIVE mg/dL
Leukocytes, UA: NEGATIVE
NITRITE: NEGATIVE
PH: 5 (ref 5.0–8.0)
Protein, ur: NEGATIVE mg/dL
Specific Gravity, Urine: 1.028 (ref 1.005–1.030)

## 2017-10-08 LAB — CBC
HEMATOCRIT: 41.6 % (ref 39.0–52.0)
HEMOGLOBIN: 13.7 g/dL (ref 13.0–17.0)
MCH: 27.4 pg (ref 26.0–34.0)
MCHC: 32.9 g/dL (ref 30.0–36.0)
MCV: 83.2 fL (ref 78.0–100.0)
Platelets: 349 10*3/uL (ref 150–400)
RBC: 5 MIL/uL (ref 4.22–5.81)
RDW: 13.6 % (ref 11.5–15.5)
WBC: 7.7 10*3/uL (ref 4.0–10.5)

## 2017-10-08 LAB — LIPASE, BLOOD: Lipase: 31 U/L (ref 11–51)

## 2017-10-08 MED ORDER — CYCLOBENZAPRINE HCL 10 MG PO TABS: 10.0000 mg | ORAL_TABLET | Freq: Two times a day (BID) | ORAL | 0 refills | Status: AC | PRN

## 2017-10-08 MED ORDER — HYDROMORPHONE HCL 1 MG/ML IJ SOLN
1.0000 mg | Freq: Once | INTRAMUSCULAR | Status: AC
  Administered 2017-10-08: 1 mg via INTRAMUSCULAR
  Filled 2017-10-08: qty 1

## 2017-10-08 MED ORDER — LORAZEPAM 1 MG PO TABS
1.0000 mg | ORAL_TABLET | Freq: Once | ORAL | Status: AC
  Administered 2017-10-08: 1 mg via ORAL
  Filled 2017-10-08: qty 1

## 2017-10-08 MED ORDER — IBUPROFEN 600 MG PO TABS: 600.0000 mg | ORAL_TABLET | Freq: Four times a day (QID) | ORAL | 0 refills | Status: DC | PRN

## 2017-10-08 MED ORDER — IBUPROFEN 800 MG PO TABS
800.0000 mg | ORAL_TABLET | Freq: Once | ORAL | Status: AC
  Administered 2017-10-08: 800 mg via ORAL
  Filled 2017-10-08: qty 1

## 2017-10-08 MED ORDER — IBUPROFEN 600 MG PO TABS: 600.0000 mg | ORAL_TABLET | Freq: Four times a day (QID) | ORAL | 0 refills | Status: AC | PRN

## 2017-10-08 NOTE — ED Triage Notes (Signed)
Pt in from home c/o R sided rib cage pain worse with inspiration and coughing, pt seen here for similar symptoms last week, denies improvement of symptoms, reports pain with deep inspiration, denies SOB, A&O x4

## 2017-10-08 NOTE — Discharge Instructions (Signed)
Your workup has been reassuring. You can take ibuprofen and flexeril for pain, you can also use ice or heat. Continue to use incentive spirometer several times a day to encourage deep breaths. You can treat your cough with over-the-counter cough syrup such as Delsym. If symptoms worsen, you developed fevers or chills, Worsening shortness of breath, abdominal pain or cough up blood or other new or concerning symptoms develop, please return to the emergency department for reevaluation. Otherwise please follow up with the [facility] clinic.

## 2017-10-08 NOTE — ED Notes (Signed)
Patient returned from ultrasound, states unable to give urine specimen at this time.

## 2017-10-08 NOTE — ED Notes (Signed)
Patient transported to Ultrasound 

## 2017-10-08 NOTE — ED Provider Notes (Signed)
[facility] EMERGENCY DEPARTMENT Provider Note   CSN: [id number] Arrival date & time: 10/08/17  1130     History   Chief Complaint Chief Complaint  Patient presents with  . Chest Pain    rib pain    HPI  Derrick Shields is a 26 y.o. Male who presents with right-sided pleuritic rib and chest pain has been going on for 4 days. Patient reports pain on the flank over the right side of the rib cage, described as constant dull ache, with sharp pains with inspiration and movement. Patient reports coughing, deep breaths, and movement make pain worse. Patient has not taken anything to treat the pain at home. Patient reports soreness in this area for the past 4 days, but reports today he was at work and the pain got worse, he wasn't doing any heavy lifting then this happened, but felt like there was a pop and it started to hurt more to take deep breaths. Patient had a URI last week and was coughing a lot, and then the pain started to develop. URI symptoms have resolved aside from cough, which is still somewhat productive, Derrick Shields has tried to treat the cough with his wife's albuterol with minimal improvement. Derrick Shields reports he has some cough at baseline from smoking. Denies fevers or chills. Patient reports one episode of vomiting this morning after coughing, denies any abdominal pain, no current nausea. Denieower extremity pain or swelling, recent travel or immobilization, history of PE or DVT, family or personal history of bleeding or clotting disorders, or hemoptysis.        History reviewed. No pertinent past medical history.  There are no active problems to display for this patient.   History reviewed. No pertinent surgical history.     Home Medications    Prior to Admission medications   Medication Sig Start Date End Date Taking? Authorizing Provider  azithromycin (ZITHROMAX Z-PAK) 250 MG tablet 2 po day one, then 1 daily x 4 days 09/28/17   Street, [place], PA-C    ibuprofen (ADVIL,MOTRIN) 600 MG tablet Take 1 tablet (600 mg total) by mouth every 8 (eight) hours as needed. 04/30/17   Felicie Morn, NP  loperamide (IMODIUM) 2 MG capsule Take 1 capsule (2 mg total) by mouth 4 (four) times daily as needed for diarrhea or loose stools. Patient not taking: Reported on 01/22/2017 01/15/17   Horton, Mayer Masker, MD  methocarbamol (ROBAXIN) 500 MG tablet Take 1 tablet (500 mg total) by mouth 2 (two) times daily. 04/30/17   Felicie Morn, NP  ondansetron (ZOFRAN ODT) 4 MG disintegrating tablet Take 1 tablet (4 mg total) by mouth every 8 (eight) hours as needed for nausea or vomiting. Patient not taking: Reported on 01/22/2017 01/15/17   Horton, Mayer Masker, MD  ondansetron (ZOFRAN ODT) 4 MG disintegrating tablet Take 1 tablet (4 mg total) by mouth every 8 (eight) hours as needed for nausea or vomiting. 09/28/17   Street, [place], PA-C    Family History No family history on file.  Social History Social History  Substance Use Topics  . Smoking status: Current Every Day Smoker  . Smokeless tobacco: Never Used  . Alcohol use Yes     Allergies   Penicillins   Review of Systems Review of Systems  Constitutional: Negative for chills and fever.  HENT: Negative for congestion, rhinorrhea and sore throat.   Eyes: Negative for visual disturbance.  Respiratory: Positive for cough and shortness of breath. Negative for chest tightness.  Cardiovascular: Positive for chest pain. Negative for palpitations and leg swelling.  Gastrointestinal: Positive for vomiting. Negative for abdominal pain and nausea.  Genitourinary: Negative for dysuria.  Musculoskeletal: Positive for myalgias. Negative for neck pain and neck stiffness.  Skin: Negative for rash.  Neurological: Negative for dizziness, weakness, light-headedness and numbness.     Physical Exam Updated Vital Signs BP 118/82 (BP Location: Left Arm)   Pulse 91   Temp 98.8 F (37.1 C) (Oral)   Resp 17   SpO2 95%    Physical Exam  Constitutional: He appears well-developed and well-nourished. No distress.  HENT:  Head: Normocephalic and atraumatic.  Eyes: Right eye exhibits no discharge. Left eye exhibits no discharge.  Neck: Neck supple.  Cardiovascular: Normal rate, regular rhythm, normal heart sounds and intact distal pulses.   Pulmonary/Chest: No respiratory distress.  Breath sounds slightly decreased throughout, poor inspiratory effort as Derrick Shields reports pain with deep breath, no wheezes rales or rhonchi on auscultation. Chest tender to palpation over right lateral rib cage, feels slightly swollen and inflamed compared to left. No tenderness to palpation on the left side of the chest.  Abdominal: Soft. Bowel sounds are normal.  Borderline tenderness of right upper quadrant, nontender in all other quadrants, negative Murphy sign, nontender at McBurney's point, no CVA tenderness  Musculoskeletal: He exhibits no edema or deformity.  No lower extremity swelling noted  Neurological: He is alert. Coordination normal.  Skin: Skin is warm and dry. Capillary refill takes less than 2 seconds. He is not diaphoretic.  Psychiatric: He has a normal mood and affect. His behavior is normal.  Nursing note and vitals reviewed.    ED Treatments / Results  Labs (all labs ordered are listed, but only abnormal results are displayed) Labs Reviewed  CBC  COMPREHENSIVE METABOLIC PANEL  LIPASE, BLOOD  URINALYSIS, ROUTINE W REFLEX MICROSCOPIC    EKG  EKG Interpretation None       Radiology Dg Chest 2 View  Result Date: 10/08/2017 CLINICAL DATA:  Right rib and flank pain following a cough. Subsequent onset of shortness of breath and chest pain. Recent URI. EXAM: CHEST  2 VIEW COMPARISON:  Chest x-ray of January 21, 2017 FINDINGS: The lungs are borderline hypoinflated. There is no focal infiltrate. There is no pleural effusion or pneumothorax. The heart and pulmonary vascularity are normal. The mediastinum is  normal in width. The bony thorax exhibits no acute abnormality. IMPRESSION: There is no active cardiopulmonary disease. Electronically Signed   By: David  [place] M.D.   On: 10/08/2017 13:11   US Abdomen Limited Ruq  Result Date: 10/08/2017 CLINICAL DATA:  RIGHT upper quadrant pain. This began earlier today. EXAM: ULTRASOUND ABDOMEN LIMITED RIGHT UPPER QUADRANT COMPARISON:  None. FINDINGS: Gallbladder: No gallstones or wall thickening visualized. No sonographic Murphy sign noted by sonographer. Common bile duct: Diameter: Normal measuring 2.6 mm Liver: No focal lesion identified. Within normal limits in parenchymal echogenicity. Portal vein is patent on color Doppler imaging with normal direction of blood flow towards the liver. IMPRESSION: Negative exam. Electronically Signed   By: Elsie Stain M.D.   On: 10/08/2017 17:49    Procedures Procedures (including critical care time)  Medications Ordered in ED Medications  ibuprofen (ADVIL,MOTRIN) tablet 800 mg (800 mg Oral Given 10/08/17 1418)  HYDROmorphone (DILAUDID) injection 1 mg (1 mg Intramuscular Given 10/08/17 1538)  LORazepam (ATIVAN) tablet 1 mg (1 mg Oral Given 10/08/17 1535)     Initial Impression / Assessment and Plan / ED Course  I have reviewed the triage vital signs and the nursing notes.  Pertinent labs & imaging results that were available during my care of the patient were reviewed by me and considered in my medical decision making (see chart for details).   Derrick Shields presents with 4 days of pleuritic chest pain and soreness on the right lateral chest wall. Vitals normal and patient well appearing. On lung exam poor inspiratory effort but lung sounds auscultated throughout, no wheezes, rales or rhonchi. Chest x-ray shows no acute cardiopulmonary disease, and no fracture. PERC negative, unlikely PE. Pain likely musculoskeletal chest wall pain, will treat with ibuprofen. Patient given incentive spirometer to encourage deep breaths and  prevent atelectasis.   On reevaluation after ibuprofen, patient reports he still very uncomfortable, and feels like he can't get up to walk because of the pain on his right side. Patient discussed with Dr. Juleen China, who evaluated him and fell with some RUQ tenderness and pain not improving, should evaluate for an intra-abdominal process. We'll get labs and right upper quadrant ultrasound. Will treat pain with Dilaudid and Ativan and reevaluate.  Labs and UA unremarkable. Right upper quadrant ultrasound shows is normal. He reports pain is much improved with additional medication and he is able to move around better and take deep breaths. Patient will be discharged home with ibuprofen and flexeril for pain. Counseled Derrick Shields on using incentive spirometry, and light stretching. Work note prodvided. Derrick Shields to follow up with [facility]. Return precautions provided.  Patient discussed with Dr. Juleen China, who saw patient as well and agrees with plan.   Final Clinical Impressions(s) / ED Diagnoses   Final diagnoses:  Chest wall pain  Pleuritic chest pain  RUQ pain    New Prescriptions Discharge Medication List as of 10/08/2017  6:49 PM    START taking these medications   Details  cyclobenzaprine (FLEXERIL) 10 MG tablet Take 1 tablet (10 mg total) by mouth 2 (two) times daily as needed for muscle spasms., Starting Mon 10/08/2017, Print         Dartha Lodge, PA-C 10/09/17 1308    Raeford Razor, MD 10/10/17 1136

## 2017-10-08 NOTE — ED Notes (Signed)
Pt instructed on how to use incentive spirometer. Pt demonstrated understanding.

## 2017-10-11 ENCOUNTER — Emergency Department (HOSPITAL_COMMUNITY): Payer: Self-pay

## 2017-10-11 ENCOUNTER — Encounter (HOSPITAL_COMMUNITY): Payer: Self-pay

## 2017-10-11 ENCOUNTER — Ambulatory Visit (HOSPITAL_COMMUNITY): Payer: Self-pay

## 2017-10-11 ENCOUNTER — Emergency Department (HOSPITAL_COMMUNITY)
Admission: EM | Admit: 2017-10-11 | Discharge: 2017-10-11 | Discharge status: Against Medical Advice | Payer: Self-pay | Attending: Emergency Medicine | Admitting: Emergency Medicine

## 2017-10-11 DIAGNOSIS — Z5321 Procedure and treatment not carried out due to patient leaving prior to being seen by health care provider: Secondary | ICD-10-CM | POA: Insufficient documentation

## 2017-10-11 DIAGNOSIS — R05 Cough: Secondary | ICD-10-CM | POA: Insufficient documentation

## 2017-10-11 NOTE — ED Triage Notes (Signed)
Patient states he has had a productive cough x 2 weeks. Patient states [facility] and URI and ws prescribed antibiotics. Patient states he he had increasing pain to the right rib area x 4 days. Patient states pain in the right rib area when he coughs, takes a deep breath, and moves.

## 2018-02-06 ENCOUNTER — Other Ambulatory Visit: Payer: Self-pay

## 2018-02-06 ENCOUNTER — Emergency Department (HOSPITAL_COMMUNITY)
Admission: EM | Admit: 2018-02-06 | Discharge: 2018-02-07 | Disposition: A | Discharge status: Home / Self Care | Payer: No Typology Code available for payment source | Attending: Emergency Medicine | Admitting: Emergency Medicine

## 2018-02-06 ENCOUNTER — Encounter (HOSPITAL_COMMUNITY): Payer: Self-pay | Admitting: Emergency Medicine

## 2018-02-06 ENCOUNTER — Emergency Department (HOSPITAL_COMMUNITY): Payer: No Typology Code available for payment source

## 2018-02-06 DIAGNOSIS — S30810A Abrasion of lower back and pelvis, initial encounter: Secondary | ICD-10-CM | POA: Insufficient documentation

## 2018-02-06 DIAGNOSIS — M79662 Pain in left lower leg: Secondary | ICD-10-CM | POA: Diagnosis not present

## 2018-02-06 DIAGNOSIS — Y929 Unspecified place or not applicable: Secondary | ICD-10-CM | POA: Diagnosis not present

## 2018-02-06 DIAGNOSIS — S70312A Abrasion, left thigh, initial encounter: Secondary | ICD-10-CM | POA: Diagnosis not present

## 2018-02-06 DIAGNOSIS — S8992XA Unspecified injury of left lower leg, initial encounter: Secondary | ICD-10-CM | POA: Diagnosis present

## 2018-02-06 DIAGNOSIS — Y999 Unspecified external cause status: Secondary | ICD-10-CM | POA: Insufficient documentation

## 2018-02-06 DIAGNOSIS — Y939 Activity, unspecified: Secondary | ICD-10-CM | POA: Insufficient documentation

## 2018-02-06 DIAGNOSIS — F17210 Nicotine dependence, cigarettes, uncomplicated: Secondary | ICD-10-CM | POA: Diagnosis not present

## 2018-02-06 DIAGNOSIS — M79605 Pain in left leg: Secondary | ICD-10-CM

## 2018-02-06 MED ORDER — HYDROMORPHONE HCL 1 MG/ML IJ SOLN
1.0000 mg | Freq: Once | INTRAMUSCULAR | Status: AC
  Administered 2018-02-06: 1 mg via INTRAMUSCULAR
  Filled 2018-02-06: qty 1

## 2018-02-06 MED ORDER — DIAZEPAM 5 MG PO TABS
5.0000 mg | ORAL_TABLET | Freq: Once | ORAL | Status: AC
  Administered 2018-02-06: 5 mg via ORAL
  Filled 2018-02-06: qty 1
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
## 2018-02-07 ENCOUNTER — Emergency Department (HOSPITAL_COMMUNITY): Payer: No Typology Code available for payment source

## 2018-02-07 MED ORDER — HYDROCODONE-ACETAMINOPHEN 5-325 MG PO TABS
1.0000 | ORAL_TABLET | Freq: Once | ORAL | Status: AC
  Administered 2018-02-07: 1 via ORAL
  Filled 2018-02-07: qty 1

## 2018-02-07 MED ORDER — HYDROCODONE-ACETAMINOPHEN 5-325 MG PO TABS: 1.0000 | ORAL_TABLET | ORAL | 0 refills | Status: DC | PRN

## 2018-02-07 MED ORDER — CYCLOBENZAPRINE HCL 10 MG PO TABS: 10.0000 mg | ORAL_TABLET | Freq: Two times a day (BID) | ORAL | 0 refills | Status: DC | PRN

## 2018-02-07 NOTE — ED Notes (Signed)
ED Provider at bedside. 

## 2018-02-07 NOTE — Discharge Instructions (Signed)
Your imaging today showed no acute fractures in your left leg.  We cannot rule out a soft tissue or ligamentous injury at this time.  Please use the knee brace and crutches to help support your leg and use the medicine to help with your symptoms.    Today he received a dose of hydromorphone (Dilaudid), and diazepam (Valium), and will be given prescription for Norco and Flexeril.  Please follow-up with the orthopedics team and a primary care physician for further management.  If any symptoms change or worsen, please return to the nearest emergency department.
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
## 2018-02-12 ENCOUNTER — Encounter (INDEPENDENT_AMBULATORY_CARE_PROVIDER_SITE_OTHER): Payer: Self-pay | Admitting: Physician Assistant

## 2018-02-12 ENCOUNTER — Ambulatory Visit (INDEPENDENT_AMBULATORY_CARE_PROVIDER_SITE_OTHER): Payer: Self-pay | Admitting: Physician Assistant

## 2018-02-12 DIAGNOSIS — M25562 Pain in left knee: Secondary | ICD-10-CM

## 2018-02-12 MED ORDER — MELOXICAM 7.5 MG PO TABS: 7.5000 mg | ORAL_TABLET | Freq: Every day | ORAL | 1 refills | Status: DC
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
## 2018-02-15 ENCOUNTER — Ambulatory Visit
Admission: RE | Admit: 2018-02-15 | Discharge: 2018-02-15 | Disposition: A | Discharge status: Home / Self Care | Payer: Self-pay | Source: Ambulatory Visit | Attending: Physician Assistant | Admitting: Physician Assistant

## 2018-02-15 DIAGNOSIS — M25562 Pain in left knee: Secondary | ICD-10-CM

## 2018-02-22 ENCOUNTER — Ambulatory Visit (HOSPITAL_COMMUNITY)
Admission: RE | Admit: 2018-02-22 | Discharge: 2018-02-22 | Disposition: A | Discharge status: Home / Self Care | Payer: No Typology Code available for payment source | Source: Ambulatory Visit | Attending: Orthopedic Surgery | Admitting: Orthopedic Surgery

## 2018-02-22 ENCOUNTER — Other Ambulatory Visit (INDEPENDENT_AMBULATORY_CARE_PROVIDER_SITE_OTHER): Payer: Self-pay | Admitting: Orthopedic Surgery

## 2018-02-22 ENCOUNTER — Ambulatory Visit (INDEPENDENT_AMBULATORY_CARE_PROVIDER_SITE_OTHER): Payer: Self-pay | Admitting: Orthopaedic Surgery

## 2018-02-22 ENCOUNTER — Encounter (INDEPENDENT_AMBULATORY_CARE_PROVIDER_SITE_OTHER): Payer: Self-pay | Admitting: Orthopaedic Surgery

## 2018-02-22 DIAGNOSIS — S83412A Sprain of medial collateral ligament of left knee, initial encounter: Secondary | ICD-10-CM | POA: Insufficient documentation

## 2018-02-22 DIAGNOSIS — S83522A Sprain of posterior cruciate ligament of left knee, initial encounter: Secondary | ICD-10-CM | POA: Insufficient documentation

## 2018-02-22 DIAGNOSIS — X58XXXA Exposure to other specified factors, initial encounter: Secondary | ICD-10-CM | POA: Diagnosis not present

## 2018-02-22 DIAGNOSIS — S83512A Sprain of anterior cruciate ligament of left knee, initial encounter: Secondary | ICD-10-CM | POA: Diagnosis not present

## 2018-02-22 NOTE — Progress Notes (Signed)
   Office Visit Note   Patient: Derrick PrestoJames Tramble           Date of Birth: [birth year]           MRN: [id number] Visit Date: 02/22/2018              Requested by: No referring provider defined for this encounter. PCP: Patient, No Pcp Per   Assessment & Plan: Visit Diagnoses:  1. Complete tear of MCL of knee, left, initial encounter   2. Tear of PCL (posterior cruciate ligament) of knee, left, initial encounter   3. Rupture of anterior cruciate ligament of left knee, initial encounter     Plan: MRI findings were reviewed with the patient.  Dr. August Saucerean also evaluated and discussed the treatment plan today with the patient and his wife.  They are in agreement with surgical repair of the MCL acutely after discussion of risk benefits alternatives to surgery. Total face to face encounter time was greater than 25 minutes and over half of this time was spent in counseling and/or coordination of care.  Follow-Up Instructions: Return if symptoms worsen or fail to improve.   Orders:  No orders of the defined types were placed in this encounter.  No orders of the defined types were placed in this encounter.     Procedures: No procedures performed   Clinical Data: No additional findings.   Subjective: Chief Complaint  Patient presents with  . Left Knee - Pain, Follow-up    Patient comes in today for follow-up of his left knee MRI.  Continues to complain of pain.    Review of Systems  Constitutional: Negative.   All other systems reviewed and are negative.    Objective: Vital Signs: There were no vitals taken for this visit.  Physical Exam  Constitutional: He is oriented to person, place, and time. He appears well-developed and well-nourished.  Pulmonary/Chest: Effort normal.  Abdominal: Soft.  Neurological: He is alert and oriented to person, place, and time.  Skin: Skin is warm.  Psychiatric: He has a normal mood and affect. His behavior is normal. Judgment and thought  content normal.  Nursing note and vitals reviewed.   Ortho Exam Left knee exam shows laxity with Lachman testing and posterior drawer.  MCL testing also shows a laxity with soft endpoint. Specialty Comments:  No specialty comments available.  Imaging: No results found.   PMFS History: Patient Active Problem List   Diagnosis Date Noted  . Acute pain of left knee 02/12/2018   History reviewed. No pertinent past medical history.  History reviewed. No pertinent family history.  History reviewed. No pertinent surgical history. Social History   Occupational History  . Not on file  Tobacco Use  . Smoking status: Current Every Day Smoker    Types: Cigarettes  . Smokeless tobacco: Never Used  Substance and Sexual Activity  . Alcohol use: No    Frequency: Never  . Drug use: No  . Sexual activity: Not on file

## 2018-02-22 NOTE — Progress Notes (Signed)
Preliminary results by tech - Left Lower Ext. Venous Duplex Completed. Negative for deep and superficial vein thrombosis in the left leg. Results given to Dr. August Saucerean.  Derrick Shields, BS, RDMS, RVT

## 2018-02-22 NOTE — Progress Notes (Signed)
I saw Derrick Shields today with Dr. Deno Etiennehu.  He is a 27 year old patient with significant left knee injury.  Now about 2 weeks out from his injury.  MRI scan shows PCL avulsion off the femur along with high-grade ACL tear as well as complete proximal avulsion of the medial collateral ligament.  On examination he does have some swelling in ultrasound negative for DVT today.  He has full extension and about 70 degrees of flexion.  He is unstable to valgus stress at 0 and 30 degrees.  Lateral structures feel intact.  Anterior posterior instability is also present.  Pedal pulses palpable.  Impression is multi-ligament injury left knee.  Plan is to repair the MCL primarily and let him rehab from that and then readdress the anterior posterior instability as needed after full healing has occurred.  I think there is a chance that he may get some scarring and low level arthrofibrosis which may for further surgery unnecessary.  Nonetheless the medial side of that knee does need to be stabilized.  If primary repair is not possible I would likely autograft augmentation with 1 of the hamstring tendons.  The risk and benefits of the procedure are discussed with the patient including but not limited to knee stiffness infection nerve damage.  All questions answered.

## 2018-02-25 ENCOUNTER — Other Ambulatory Visit: Payer: Self-pay

## 2018-02-25 ENCOUNTER — Encounter (HOSPITAL_COMMUNITY): Payer: Self-pay | Admitting: *Deleted
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
## 2018-02-25 NOTE — Progress Notes (Signed)
   02/25/18 1238  OBSTRUCTIVE SLEEP APNEA  Have you ever been diagnosed with sleep apnea through a sleep study? No  Do you snore loudly (loud enough to be heard through closed doors)?  1  Do you often feel tired, fatigued, or sleepy during the daytime (such as falling asleep during driving or talking to someone)? 1  Has anyone observed you stop breathing during your sleep? 1  Do you have, or are you being treated for high blood pressure? 0  BMI more than 35 kg/m2? 1  Age > 50 (1-yes) 0  Male Gender (Yes=1) 1  Obstructive Sleep Apnea Score 5

## 2018-02-25 NOTE — Progress Notes (Signed)
Spoke with pt for pre-op call. Pt denied cardiac history, HTN, or diabetes. Pt has positive Stopbang assessment, but he does not have a PCP. Pt states he usually sleeps until noon. Dr. Michelle Piperssey ok'ed pt to have a light breakfast by 7:00 AM, then Clear liquids until 9:00 AM, then NPO. Since pt sleeps so late, I told him if he got up before 9 AM he could have clear liquids until 9 AM and then NPO. He states that he probably wouldn't get up that early.

## 2018-02-26 ENCOUNTER — Ambulatory Visit (HOSPITAL_COMMUNITY)
Admission: RE | Admit: 2018-02-26 | Discharge: 2018-02-26 | Disposition: A | Discharge status: Home / Self Care | Payer: No Typology Code available for payment source | Source: Ambulatory Visit | Attending: Orthopedic Surgery | Admitting: Orthopedic Surgery

## 2018-02-26 ENCOUNTER — Encounter (HOSPITAL_COMMUNITY): Payer: Self-pay | Admitting: *Deleted

## 2018-02-26 ENCOUNTER — Encounter (HOSPITAL_COMMUNITY)
Admission: RE | Disposition: A | Discharge status: Home / Self Care | Payer: Self-pay | Source: Ambulatory Visit | Attending: Orthopedic Surgery

## 2018-02-26 ENCOUNTER — Ambulatory Visit (HOSPITAL_COMMUNITY): Payer: No Typology Code available for payment source | Admitting: Certified Registered Nurse Anesthetist

## 2018-02-26 DIAGNOSIS — S83412A Sprain of medial collateral ligament of left knee, initial encounter: Secondary | ICD-10-CM | POA: Diagnosis present

## 2018-02-26 DIAGNOSIS — Y9355 Activity, bike riding: Secondary | ICD-10-CM | POA: Diagnosis not present

## 2018-02-26 DIAGNOSIS — F17210 Nicotine dependence, cigarettes, uncomplicated: Secondary | ICD-10-CM | POA: Diagnosis not present

## 2018-02-26 DIAGNOSIS — Z880 Allergy status to penicillin: Secondary | ICD-10-CM | POA: Insufficient documentation

## 2018-02-26 DIAGNOSIS — Z6841 Body Mass Index (BMI) 40.0 and over, adult: Secondary | ICD-10-CM | POA: Diagnosis not present

## 2018-02-26 DIAGNOSIS — K219 Gastro-esophageal reflux disease without esophagitis: Secondary | ICD-10-CM | POA: Diagnosis not present

## 2018-02-26 DIAGNOSIS — S83512A Sprain of anterior cruciate ligament of left knee, initial encounter: Secondary | ICD-10-CM | POA: Diagnosis not present

## 2018-02-26 HISTORY — DX: Gastro-esophageal reflux disease without esophagitis: K21.9

## 2018-02-26 HISTORY — DX: Other specified postprocedural states: Z98.890

## 2018-02-26 HISTORY — PX: MEDIAL COLLATERAL LIGAMENT REPAIR, KNEE: SHX2019

## 2018-02-26 HISTORY — DX: Other specified postprocedural states: R11.2

## 2018-02-26 LAB — CBC
HCT: 41.6 % (ref 39.0–52.0)
HEMOGLOBIN: 13.8 g/dL (ref 13.0–17.0)
MCH: 27.7 pg (ref 26.0–34.0)
MCHC: 33.2 g/dL (ref 30.0–36.0)
MCV: 83.5 fL (ref 78.0–100.0)
Platelets: 395 10*3/uL (ref 150–400)
RBC: 4.98 MIL/uL (ref 4.22–5.81)
RDW: 13.6 % (ref 11.5–15.5)
WBC: 8.8 10*3/uL (ref 4.0–10.5)

## 2018-02-26 SURGERY — RECONSTRUCTION, LIGAMENT, MEDIAL COLLATERAL, KNEE
Anesthesia: General | Site: Leg Upper | Laterality: Left

## 2018-02-26 MED ORDER — MIDAZOLAM HCL 2 MG/2ML IJ SOLN
INTRAMUSCULAR | Status: AC
  Administered 2018-02-26: 2 mg via INTRAVENOUS
  Filled 2018-02-26: qty 2

## 2018-02-26 MED ORDER — ROCURONIUM BROMIDE 100 MG/10ML IV SOLN
INTRAVENOUS | Status: DC | PRN
  Administered 2018-02-26: 80 mg via INTRAVENOUS

## 2018-02-26 MED ORDER — ONDANSETRON HCL 4 MG/2ML IJ SOLN
INTRAMUSCULAR | Status: AC
  Filled 2018-02-26: qty 2

## 2018-02-26 MED ORDER — DEXMEDETOMIDINE HCL 200 MCG/2ML IV SOLN
INTRAVENOUS | Status: DC | PRN
  Administered 2018-02-26: 8 ug via INTRAVENOUS
  Administered 2018-02-26 (×2): 12 ug via INTRAVENOUS
  Administered 2018-02-26: 8 ug via INTRAVENOUS

## 2018-02-26 MED ORDER — ESMOLOL HCL 100 MG/10ML IV SOLN
INTRAVENOUS | Status: AC
  Filled 2018-02-26: qty 10

## 2018-02-26 MED ORDER — FENTANYL CITRATE (PF) 100 MCG/2ML IJ SOLN
INTRAMUSCULAR | Status: AC
  Administered 2018-02-26: 50 ug via INTRAVENOUS
  Filled 2018-02-26: qty 2

## 2018-02-26 MED ORDER — FENTANYL CITRATE (PF) 250 MCG/5ML IJ SOLN
INTRAMUSCULAR | Status: AC
  Filled 2018-02-26: qty 5

## 2018-02-26 MED ORDER — CLINDAMYCIN PHOSPHATE 900 MG/50ML IV SOLN
INTRAVENOUS | Status: AC
  Filled 2018-02-26: qty 50

## 2018-02-26 MED ORDER — ONDANSETRON HCL 4 MG/2ML IJ SOLN
INTRAMUSCULAR | Status: DC | PRN
  Administered 2018-02-26: 4 mg via INTRAVENOUS

## 2018-02-26 MED ORDER — MIDAZOLAM HCL 2 MG/2ML IJ SOLN
INTRAMUSCULAR | Status: AC
  Filled 2018-02-26: qty 2

## 2018-02-26 MED ORDER — DEXAMETHASONE SODIUM PHOSPHATE 10 MG/ML IJ SOLN
INTRAMUSCULAR | Status: DC | PRN
  Administered 2018-02-26: 10 mg via INTRAVENOUS

## 2018-02-26 MED ORDER — PROMETHAZINE HCL 25 MG/ML IJ SOLN: 6.2500 mg | INTRAMUSCULAR | Status: DC | PRN

## 2018-02-26 MED ORDER — PHENYLEPHRINE 40 MCG/ML (10ML) SYRINGE FOR IV PUSH (FOR BLOOD PRESSURE SUPPORT)
PREFILLED_SYRINGE | INTRAVENOUS | Status: AC
  Filled 2018-02-26: qty 10

## 2018-02-26 MED ORDER — MIDAZOLAM HCL 5 MG/5ML IJ SOLN
INTRAMUSCULAR | Status: DC | PRN
  Administered 2018-02-26 (×2): 1 mg via INTRAVENOUS

## 2018-02-26 MED ORDER — MORPHINE SULFATE (PF) 4 MG/ML IV SOLN
INTRAVENOUS | Status: AC
  Filled 2018-02-26: qty 2

## 2018-02-26 MED ORDER — 0.9 % SODIUM CHLORIDE (POUR BTL) OPTIME
TOPICAL | Status: DC | PRN
  Administered 2018-02-26: 2000 mL

## 2018-02-26 MED ORDER — DEXMEDETOMIDINE HCL IN NACL 200 MCG/50ML IV SOLN
INTRAVENOUS | Status: AC
  Filled 2018-02-26: qty 100

## 2018-02-26 MED ORDER — LACTATED RINGERS IV SOLN
INTRAVENOUS | Status: DC
  Administered 2018-02-26 (×3): via INTRAVENOUS

## 2018-02-26 MED ORDER — SUGAMMADEX SODIUM 500 MG/5ML IV SOLN
INTRAVENOUS | Status: AC
  Filled 2018-02-26: qty 5

## 2018-02-26 MED ORDER — OXYCODONE HCL 5 MG PO TABS: 5.0000 mg | ORAL_TABLET | Freq: Once | ORAL | Status: DC | PRN

## 2018-02-26 MED ORDER — CLONIDINE HCL (ANALGESIA) 100 MCG/ML EP SOLN
EPIDURAL | Status: DC | PRN
  Administered 2018-02-26: 1 mL

## 2018-02-26 MED ORDER — ESMOLOL HCL 100 MG/10ML IV SOLN
INTRAVENOUS | Status: DC | PRN
  Administered 2018-02-26 (×2): 30 mg via INTRAVENOUS
  Administered 2018-02-26 (×2): 20 mg via INTRAVENOUS

## 2018-02-26 MED ORDER — LIDOCAINE HCL (CARDIAC) 20 MG/ML IV SOLN
INTRAVENOUS | Status: DC | PRN
  Administered 2018-02-26: 80 mg via INTRAVENOUS

## 2018-02-26 MED ORDER — FENTANYL CITRATE (PF) 100 MCG/2ML IJ SOLN
INTRAMUSCULAR | Status: DC | PRN
  Administered 2018-02-26 (×3): 100 ug via INTRAVENOUS
  Administered 2018-02-26 (×2): 50 ug via INTRAVENOUS

## 2018-02-26 MED ORDER — FENTANYL CITRATE (PF) 100 MCG/2ML IJ SOLN: 25.0000 ug | INTRAMUSCULAR | Status: DC | PRN

## 2018-02-26 MED ORDER — DIPHENHYDRAMINE HCL 50 MG/ML IJ SOLN
INTRAMUSCULAR | Status: AC
  Filled 2018-02-26: qty 1

## 2018-02-26 MED ORDER — LIDOCAINE 2% (20 MG/ML) 5 ML SYRINGE
INTRAMUSCULAR | Status: AC
  Filled 2018-02-26: qty 5

## 2018-02-26 MED ORDER — EPINEPHRINE PF 1 MG/ML IJ SOLN
INTRAMUSCULAR | Status: AC
  Filled 2018-02-26: qty 1

## 2018-02-26 MED ORDER — CLONIDINE HCL (ANALGESIA) 100 MCG/ML EP SOLN
150.0000 ug | Freq: Once | EPIDURAL | Status: DC
  Filled 2018-02-26: qty 10

## 2018-02-26 MED ORDER — DEXAMETHASONE SODIUM PHOSPHATE 10 MG/ML IJ SOLN
INTRAMUSCULAR | Status: AC
  Filled 2018-02-26: qty 1

## 2018-02-26 MED ORDER — BUPIVACAINE HCL 0.25 % IJ SOLN
INTRAMUSCULAR | Status: DC | PRN
  Administered 2018-02-26: 30 mL

## 2018-02-26 MED ORDER — BUPIVACAINE HCL (PF) 0.25 % IJ SOLN
INTRAMUSCULAR | Status: AC
  Filled 2018-02-26: qty 30

## 2018-02-26 MED ORDER — CLINDAMYCIN PHOSPHATE 900 MG/50ML IV SOLN
900.0000 mg | INTRAVENOUS | Status: AC
  Administered 2018-02-26: 900 mg via INTRAVENOUS

## 2018-02-26 MED ORDER — PROPOFOL 10 MG/ML IV BOLUS
INTRAVENOUS | Status: DC | PRN
  Administered 2018-02-26: 50 mg via INTRAVENOUS
  Administered 2018-02-26: 300 mg via INTRAVENOUS
  Administered 2018-02-26: 50 mg via INTRAVENOUS

## 2018-02-26 MED ORDER — OXYCODONE HCL 5 MG/5ML PO SOLN: 5.0000 mg | Freq: Once | ORAL | Status: DC | PRN

## 2018-02-26 MED ORDER — BUPIVACAINE-EPINEPHRINE (PF) 0.5% -1:200000 IJ SOLN
INTRAMUSCULAR | Status: DC | PRN
  Administered 2018-02-26: 30 mL via PERINEURAL

## 2018-02-26 MED ORDER — CHLORHEXIDINE GLUCONATE 4 % EX LIQD: 60.0000 mL | Freq: Once | CUTANEOUS | Status: DC

## 2018-02-26 MED ORDER — FENTANYL CITRATE (PF) 100 MCG/2ML IJ SOLN
50.0000 ug | Freq: Once | INTRAMUSCULAR | Status: AC
  Administered 2018-02-26: 50 ug via INTRAVENOUS

## 2018-02-26 MED ORDER — DIPHENHYDRAMINE HCL 50 MG/ML IJ SOLN
INTRAMUSCULAR | Status: DC | PRN
  Administered 2018-02-26: 12.5 mg via INTRAVENOUS

## 2018-02-26 MED ORDER — MIDAZOLAM HCL 2 MG/2ML IJ SOLN
2.0000 mg | Freq: Once | INTRAMUSCULAR | Status: AC
  Administered 2018-02-26: 2 mg via INTRAVENOUS

## 2018-02-26 MED ORDER — SUGAMMADEX SODIUM 500 MG/5ML IV SOLN
INTRAVENOUS | Status: DC | PRN
  Administered 2018-02-26: 400 mg via INTRAVENOUS

## 2018-02-26 MED ORDER — MORPHINE SULFATE (PF) 4 MG/ML IV SOLN
INTRAVENOUS | Status: DC | PRN
  Administered 2018-02-26: 4 mg via INTRAVENOUS

## 2018-02-26 MED ORDER — PROPOFOL 10 MG/ML IV BOLUS
INTRAVENOUS | Status: AC
  Filled 2018-02-26: qty 20

## 2018-02-26 SURGICAL SUPPLY — 95 items
ALCOHOL 70% 16 OZ (MISCELLANEOUS) ×3 IMPLANT
ANCHOR SUT BIO SW 4.75X19.1 (Anchor) ×3 IMPLANT
BANDAGE ESMARK 6X9 LF (GAUZE/BANDAGES/DRESSINGS) IMPLANT
BLADE CUTTER GATOR 3.5 (BLADE) IMPLANT
BLADE GREAT WHITE 4.2 (BLADE) ×2 IMPLANT
BLADE GREAT WHITE 4.2MM (BLADE) ×1
BLADE SURG 10 STRL SS (BLADE) ×3 IMPLANT
BLADE SURG 15 STRL LF DISP TIS (BLADE) ×2 IMPLANT
BLADE SURG 15 STRL SS (BLADE) ×4
BNDG ELASTIC 6X15 VLCR STRL LF (GAUZE/BANDAGES/DRESSINGS) ×3 IMPLANT
BNDG ESMARK 6X9 LF (GAUZE/BANDAGES/DRESSINGS)
BUR OVAL 6.0 (BURR) ×3 IMPLANT
CLOSURE WOUND 1/2 X4 (GAUZE/BANDAGES/DRESSINGS) ×1
COVER MAYO STAND STRL (DRAPES) ×3 IMPLANT
COVER SURGICAL LIGHT HANDLE (MISCELLANEOUS) ×3 IMPLANT
CUFF TOURNIQUET SINGLE 34IN LL (TOURNIQUET CUFF) IMPLANT
CUFF TOURNIQUET SINGLE 44IN (TOURNIQUET CUFF) IMPLANT
DECANTER SPIKE VIAL GLASS SM (MISCELLANEOUS) ×3 IMPLANT
DRAPE ARTHROSCOPY W/POUCH 114 (DRAPES) ×3 IMPLANT
DRAPE INCISE IOBAN 66X45 STRL (DRAPES) ×3 IMPLANT
DRAPE ORTHO SPLIT 77X108 STRL (DRAPES) ×2
DRAPE SURG ORHT 6 SPLT 77X108 (DRAPES) ×1 IMPLANT
DRAPE U-SHAPE 47X51 STRL (DRAPES) ×3 IMPLANT
DRESSING AQUACEL AG SP 3.5X6 (GAUZE/BANDAGES/DRESSINGS) ×1 IMPLANT
DRILL FLIPCUTTER II 7.5MM (MISCELLANEOUS) IMPLANT
DRILL FLIPCUTTER II 8.0MM (INSTRUMENTS) IMPLANT
DRILL FLIPCUTTER II 8.5MM (INSTRUMENTS) IMPLANT
DRILL FLIPCUTTER II 9.0MM (INSTRUMENTS) IMPLANT
DRSG AQUACEL AG SP 3.5X6 (GAUZE/BANDAGES/DRESSINGS) ×3
DRSG PAD ABDOMINAL 8X10 ST (GAUZE/BANDAGES/DRESSINGS) ×3 IMPLANT
DRSG TEGADERM 4X4.75 (GAUZE/BANDAGES/DRESSINGS) ×12 IMPLANT
DURAPREP 26ML APPLICATOR (WOUND CARE) ×6 IMPLANT
ELECT REM PT RETURN 9FT ADLT (ELECTROSURGICAL) ×3
ELECTRODE REM PT RTRN 9FT ADLT (ELECTROSURGICAL) ×1 IMPLANT
FLIPCUTTER II 7.5MM (MISCELLANEOUS)
FLIPCUTTER II 8.0MM (INSTRUMENTS)
FLIPCUTTER II 8.5MM (INSTRUMENTS)
FLIPCUTTER II 9.0MM (INSTRUMENTS)
GAUZE XEROFORM 1X8 LF (GAUZE/BANDAGES/DRESSINGS) ×6 IMPLANT
GLOVE BIOGEL PI IND STRL 8 (GLOVE) ×1 IMPLANT
GLOVE BIOGEL PI INDICATOR 8 (GLOVE) ×2
GLOVE ORTHO TXT STRL SZ7.5 (GLOVE) ×3 IMPLANT
GLOVE SURG ORTHO 8.0 STRL STRW (GLOVE) ×3 IMPLANT
GOWN STRL REUS W/ TWL LRG LVL3 (GOWN DISPOSABLE) ×3 IMPLANT
GOWN STRL REUS W/TWL LRG LVL3 (GOWN DISPOSABLE) ×6
IMMOBILIZER KNEE 22 (SOFTGOODS) ×3 IMPLANT
IMMOBILIZER KNEE 22 UNIV (SOFTGOODS) ×3 IMPLANT
KIT BASIN OR (CUSTOM PROCEDURE TRAY) ×3 IMPLANT
KIT BIO-TENODESIS 3X8 DISP (MISCELLANEOUS) ×2
KIT BIOCARTILAGE DEL W/SYRINGE (KITS) IMPLANT
KIT BIOCARTILAGE LG JOINT MIX (KITS) ×3 IMPLANT
KIT INSRT BABSR STRL DISP BTN (MISCELLANEOUS) ×1 IMPLANT
KIT ROOM TURNOVER OR (KITS) ×3 IMPLANT
MANIFOLD NEPTUNE II (INSTRUMENTS) ×3 IMPLANT
NEEDLE 18GX1X1/2 (RX/OR ONLY) (NEEDLE) ×3 IMPLANT
NEEDLE HYPO 18GX1.5 BLUNT FILL (NEEDLE) ×3 IMPLANT
NS IRRIG 1000ML POUR BTL (IV SOLUTION) ×3 IMPLANT
PACK ARTHROSCOPY DSU (CUSTOM PROCEDURE TRAY) ×3 IMPLANT
PAD ARMBOARD 7.5X6 YLW CONV (MISCELLANEOUS) ×6 IMPLANT
PAD CAST 4YDX4 CTTN HI CHSV (CAST SUPPLIES) ×1 IMPLANT
PADDING CAST COTTON 4X4 STRL (CAST SUPPLIES) ×2
PADDING CAST COTTON 6X4 STRL (CAST SUPPLIES) ×3 IMPLANT
PENCIL BUTTON HOLSTER BLD 10FT (ELECTRODE) ×3 IMPLANT
SET ARTHROSCOPY TUBING (MISCELLANEOUS) ×2
SET ARTHROSCOPY TUBING LN (MISCELLANEOUS) ×1 IMPLANT
SPONGE GAUZE 4X4 12PLY STER LF (GAUZE/BANDAGES/DRESSINGS) ×3 IMPLANT
SPONGE LAP 18X18 X RAY DECT (DISPOSABLE) ×3 IMPLANT
SPONGE LAP 4X18 X RAY DECT (DISPOSABLE) ×6 IMPLANT
STRIP CLOSURE SKIN 1/2X4 (GAUZE/BANDAGES/DRESSINGS) ×2 IMPLANT
SUCTION FRAZIER HANDLE 10FR (MISCELLANEOUS) ×2
SUCTION TUBE FRAZIER 10FR DISP (MISCELLANEOUS) ×1 IMPLANT
SUT 0 FIBERLOOP 38 BLUE TPR ND (SUTURE) ×12
SUT ETHILON 3 0 PS 1 (SUTURE) ×6 IMPLANT
SUT MNCRL AB 3-0 PS2 18 (SUTURE) ×6 IMPLANT
SUT VIC AB 0 CT1 27 (SUTURE) ×8
SUT VIC AB 0 CT1 27XBRD ANBCTR (SUTURE) ×4 IMPLANT
SUT VIC AB 1 CT1 27 (SUTURE) ×10
SUT VIC AB 1 CT1 27XBRD ANBCTR (SUTURE) ×5 IMPLANT
SUT VIC AB 2-0 CT1 27 (SUTURE) ×6
SUT VIC AB 2-0 CT1 TAPERPNT 27 (SUTURE) ×3 IMPLANT
SUT VICRYL 0 UR6 27IN ABS (SUTURE) ×3 IMPLANT
SUTURE 0 FIBERLP 38 BLU TPR ND (SUTURE) ×4 IMPLANT
SUTURE TAPE 1.3 40 TPR END (SUTURE) ×1 IMPLANT
SUTURETAPE 1.3 40 TPR END (SUTURE) ×3
SYR 30ML LL (SYRINGE) ×3 IMPLANT
SYR 3ML LL SCALE MARK (SYRINGE) ×3 IMPLANT
SYR BULB IRRIGATION 50ML (SYRINGE) ×3 IMPLANT
SYR TB 1ML LUER SLIP (SYRINGE) ×3 IMPLANT
TAPE STRIPS DRAPE STRL (GAUZE/BANDAGES/DRESSINGS) ×3 IMPLANT
TOWEL OR 17X24 6PK STRL BLUE (TOWEL DISPOSABLE) ×3 IMPLANT
TOWEL OR 17X26 10 PK STRL BLUE (TOWEL DISPOSABLE) ×3 IMPLANT
UNDERPAD 30X30 (UNDERPADS AND DIAPERS) ×3 IMPLANT
WAND SERFAS ENERGY SUPER 90 (SURGICAL WAND) IMPLANT
WRAP KNEE MAXI GEL POST OP (GAUZE/BANDAGES/DRESSINGS) ×3 IMPLANT
YANKAUER SUCT BULB TIP NO VENT (SUCTIONS) ×3 IMPLANT

## 2018-02-26 NOTE — Interval H&P Note (Signed)
History and Physical Interval Note:  02/26/2018 5:02 PM  Derrick Shields  has presented today for surgery, with the diagnosis of Medial Collateral Ligament Injury Left Knee  The various methods of treatment have been discussed with the patient and family. After consideration of risks, benefits and other options for treatment, the patient has consented to  Procedure(s): LEFT KNEE OPEN MEDIAL COLLATERAL LIGAMENT (MCL) REPAIR (Left) as a surgical intervention .  The patient's history has been reviewed, patient examined, no change in status, stable for surgery.  I have reviewed the patient's chart and labs.  Questions were answered to the patient's satisfaction.     Derrick Shields

## 2018-02-26 NOTE — Anesthesia Preprocedure Evaluation (Addendum)
Anesthesia Evaluation  Patient identified by MRN, date of birth, ID band Patient awake    Reviewed: Allergy & Precautions, NPO status , Patient's Chart, lab work & pertinent test results  History of Anesthesia Complications (+) PONV and Emergence Delirium  Airway Mallampati: II  TM Distance: >3 FB Neck ROM: Full    Dental  (+) Dental Advisory Given, Teeth Intact   Pulmonary Current Smoker,  Suspected OSA - not yet tested formally   Pulmonary exam normal breath sounds clear to auscultation       Cardiovascular negative cardio ROS Normal cardiovascular exam Rhythm:Regular Rate:Normal     Neuro/Psych negative neurological ROS  negative psych ROS   GI/Hepatic Neg liver ROS, GERD  Controlled and Medicated,  Endo/Other  Morbid obesity  Renal/GU negative Renal ROS  negative genitourinary   Musculoskeletal negative musculoskeletal ROS (+)   Abdominal   Peds  Hematology negative hematology ROS (+)   Anesthesia Other Findings   Reproductive/Obstetrics                          Anesthesia Physical Anesthesia Plan  ASA: III  Anesthesia Plan: General   Post-op Pain Management:  Regional for Post-op pain   Induction: Intravenous  PONV Risk Score and Plan: 3 and Treatment may vary due to age or medical condition, Ondansetron, Dexamethasone and Midazolam  Airway Management Planned: Oral ETT  Additional Equipment: None  Intra-op Plan:   Post-operative Plan: Extubation in OR  Informed Consent: I have reviewed the patients History and Physical, chart, labs and discussed the procedure including the risks, benefits and alternatives for the proposed anesthesia with the patient or authorized representative who has indicated his/her understanding and acceptance.   Dental advisory given  Plan Discussed with: CRNA  Anesthesia Plan Comments:        Anesthesia Quick Evaluation

## 2018-02-26 NOTE — Anesthesia Postprocedure Evaluation (Signed)
Anesthesia Post Note  Patient: Derrick Shields  Procedure(s) Performed: LEFT KNEE OPEN MEDIAL COLLATERAL LIGAMENT (MCL) REPAIR (Left Leg Upper)     Patient location during evaluation: PACU Anesthesia Type: General Level of consciousness: sedated and patient cooperative Pain management: pain level controlled Vital Signs Assessment: post-procedure vital signs reviewed and stable Respiratory status: spontaneous breathing Cardiovascular status: stable Anesthetic complications: no    Last Vitals:  Vitals:   02/26/18 2105 02/26/18 2115  BP: 126/83   Pulse: 88 96  Resp: 16 16  Temp:  36.6 C  SpO2: 95% 98%    Last Pain:  Vitals:   02/26/18 2115  TempSrc:   PainSc: 0-No pain                 Lewie LoronJohn Zamarian Scarano

## 2018-02-26 NOTE — Anesthesia Procedure Notes (Signed)
Anesthesia Regional Block: Adductor canal block   Pre-Anesthetic Checklist: ,, timeout performed, Correct Patient, Correct Site, Correct Laterality, Correct Procedure, Correct Position, site marked, Risks and benefits discussed,  Surgical consent,  Pre-op evaluation,  At surgeon's request and post-op pain management  Laterality: Left  Prep: chloraprep       Needles:  Injection technique: Single-shot  Needle Type: Echogenic Needle     Needle Length: 10cm  Needle Gauge: 21     Additional Needles:   Narrative:  Start time: 02/26/2018 4:27 PM End time: 02/26/2018 4:31 PM Injection made incrementally with aspirations every 5 mL.  Performed by: Personally  Anesthesiologist: Beryle LatheBrock, Thomas E, MD  Additional Notes: No pain on injection. No increased resistance to injection. Injection made in 5cc increments. Good needle visualization. Patient tolerated the procedure well.

## 2018-02-26 NOTE — Anesthesia Procedure Notes (Signed)
Procedure Name: Intubation Date/Time: 02/26/2018 5:29 PM Performed by: Inda Coke, CRNA Pre-anesthesia Checklist: Patient identified, Emergency Drugs available, Suction available and Patient being monitored Patient Re-evaluated:Patient Re-evaluated prior to induction Oxygen Delivery Method: Circle System Utilized Preoxygenation: Pre-oxygenation with 100% oxygen Induction Type: IV induction Ventilation: Mask ventilation without difficulty and Oral airway inserted - appropriate to patient size Laryngoscope Size: Mac and 4 Grade View: Grade I Tube type: Oral Number of attempts: 1 Airway Equipment and Method: Stylet and Oral airway Placement Confirmation: ETT inserted through vocal cords under direct vision,  positive ETCO2 and breath sounds checked- equal and bilateral Secured at: 23 cm Tube secured with: Tape Dental Injury: Teeth and Oropharynx as per pre-operative assessment

## 2018-02-26 NOTE — Transfer of Care (Signed)
Immediate Anesthesia Transfer of Care Note  Patient: Derrick BurkeJames B Appelbaum  Procedure(s) Performed: LEFT KNEE OPEN MEDIAL COLLATERAL LIGAMENT (MCL) REPAIR (Left Leg Upper)  Patient Location: PACU  Anesthesia Type:General  Level of Consciousness: drowsy  Airway & Oxygen Therapy: Patient Spontanous Breathing and Patient connected to face mask oxygen  Post-op Assessment: Report given to RN and Post -op Vital signs reviewed and stable  Post vital signs: Reviewed and stable  Last Vitals:  Vitals:   02/26/18 1634 02/26/18 1948  BP: (!) 121/58 (!) 130/103  Pulse: 89 93  Resp: 11 15  Temp:  36.5 C  SpO2: 98% 93%    Last Pain:  Vitals:   02/26/18 1948  TempSrc:   PainSc: Asleep      Patients Stated Pain Goal: 4 (02/26/18 1401)  Complications: No apparent anesthesia complications

## 2018-02-27 ENCOUNTER — Encounter (HOSPITAL_COMMUNITY): Payer: Self-pay | Admitting: Orthopedic Surgery

## 2018-02-27 NOTE — Brief Op Note (Signed)
02/26/2018  9:49 AM  PATIENT:  Derrick Shields  27 y.o. male  PRE-OPERATIVE DIAGNOSIS:  Medial Collateral Ligament Injury Left Knee  POST-OPERATIVE DIAGNOSIS:  Medial Collateral Ligament Injury Left Knee  PROCEDURE:  Procedure(s): LEFT KNEE OPEN MEDIAL COLLATERAL LIGAMENT (MCL) REPAIR  SURGEON:  Surgeon(s): August Saucerean, Corrie MckusickGregory Scott, MD  ASSISTANT: Albin Fellingarla  RNFA  ANESTHESIA:   general  EBL: 50 ml    Total I/O In: 2170 [P.O.:220; I.V.:1950] Out: 50 [Blood:50]  BLOOD ADMINISTERED: none  DRAINS: none   LOCAL MEDICATIONS USED: Marcaine morphine clonidine  SPECIMEN:  No Specimen  COUNTS:  YES  TOURNIQUET:   Total Tourniquet Time Documented: Thigh (Left) - 62 minutes Total: Thigh (Left) - 62 minutes   DICTATION: .Other Dictation: Dictation Number [id number]  PLAN OF CARE: Discharge to home after PACU  PATIENT DISPOSITION:  PACU - hemodynamically stable

## 2018-02-27 NOTE — Op Note (Signed)
NAME:  Scarlett PrestoHYLTON, Saveon                     ACCOUNT NO.:  MEDICAL RECORD NO.:  [id number]  LOCATION:                                 FACILITY:  PHYSICIAN:  Burnard BuntingG. Scott Enjoli Tidd, M.D.    DATE OF BIRTH:  06-21-1991  DATE OF PROCEDURE: DATE OF DISCHARGE:                              OPERATIVE REPORT   PREOPERATIVE DIAGNOSIS:  Left knee medial collateral ligament tear and avulsion.  POSTOPERATIVE DIAGNOSIS:  Left knee medial collateral ligament tear and avulsion.  PROCEDURE:  Left the superficial and deep MCL repair.  SURGEON:  Burnard BuntingG. Scott Anajulia Leyendecker, M.D.  ASSISTANT:  Patrick Jupiterarla Bethune, RNFA.  INDICATIONS:  [name] is a 27 year old patient 3 weeks out multi ligament injury to the left knee.  He presents for staged management of this instability.  The patient understands the risks and benefits of surgery.  Left leg was examined under anesthesia found to have instability to valgus stress at 0 and 30 degrees.  The patient did have good stability to varus stress at 0 and 30 degrees.  No posterolateral rotatory instability was noted.  There was about 7-8 mm of anterior-posterior instability to the left knee, but not as much as it would appear from the scan.  In general, this is an injury, which I think has some potential to scar in and heal and become stable if the medial side is stabilized.  PROCEDURE IN DETAIL:  The patient was brought to the operating room, where general endotracheal anesthesia was induced.  Preoperative antibiotics administered.  Time-out was called.  Left leg was prescrubbed with alcohol and Betadine, allowed to air dry, prepped with DuraPrep solution and draped in sterile manner.  Ioban used to cover the operative field.  Leg was elevated, exsanguinated with the Esmarch wrap. Tourniquet was inflated.  Incision was made over the medial epicondyle extending distally.  Skin and subcutaneous tissue were sharply divided. In layer 1, there was transverse disruption of the  retinacular structures.  This communicated down to the joint.  There was also tear both the deep medial collateral ligament as well as the superficial medial collateral ligament with some retraction.  These structures were bluntly dissected from scar tissue within the surrounding tissue structures.  The deep MCL and capsular tissue were repaired in to in with the leg in extension with varus stress applied.  This was done with nonabsorbable FiberWire suture, which gave an excellent repair.  The superficial MCL was then sutured using 2-0 FiberWire/tapes type suture. This was placed in grasping fashion through the ligament, which remained attached distally.  It was brought up and attached to the visualized attachment point on the medial epicondyle of the femur.  Once this was done, the knee was taken through range of motion and found to have good isometry.  Using accessory FiberWire sutures placed through the stump of the MCL as well as a SwiveLock suture, the ligament was reattached to the medial condyle and with the leg in extension.  This gave very good stability and resistance to valgus stress at both 0 and 30 degrees. Once this was done, the knee joint was thoroughly irrigated.  The tourniquet was released.  Superficial layer was also closed using absorbable #1 Vicryl suture.  Solution of Marcaine, morphine, and clonidine injected into the skin edges for postop pain relief.  Skin was closed using interrupted inverted 2-0 Vicryl suture, followed by 3-0 Monocryl.  Steri-Strips and Aquacel dressing placed.  Knee immobilizer placed with the leg to be maintained in full extension.  The patient tolerated the procedure well without immediate complication.  Transferred to the recovery room in stable condition.     Burnard Bunting, M.D.     GSD/MEDQ  D:  02/27/2018  T:  02/27/2018  Job:  [id number]

## 2018-03-01 ENCOUNTER — Telehealth (INDEPENDENT_AMBULATORY_CARE_PROVIDER_SITE_OTHER): Payer: Self-pay

## 2018-03-01 ENCOUNTER — Inpatient Hospital Stay (INDEPENDENT_AMBULATORY_CARE_PROVIDER_SITE_OTHER): Payer: Self-pay | Admitting: Orthopaedic Surgery

## 2018-03-01 NOTE — Telephone Encounter (Signed)
Patient called stating that he has redness below his bandage on left leg and a red spot that has came through his bandage, which could be dry blood, but wasn't sure.  He is having chest pain and he is not able to walk.  Would like to know he needed to be seen or go to the ER?  Cb# is [phone].  Please advise.  Thank you.

## 2018-03-01 NOTE — Telephone Encounter (Signed)
Patient was advised to go to ER for symptoms he is currently having.

## 2018-03-08 ENCOUNTER — Ambulatory Visit (INDEPENDENT_AMBULATORY_CARE_PROVIDER_SITE_OTHER): Payer: Self-pay | Admitting: Orthopedic Surgery

## 2018-03-08 ENCOUNTER — Encounter (INDEPENDENT_AMBULATORY_CARE_PROVIDER_SITE_OTHER): Payer: Self-pay | Admitting: Orthopedic Surgery

## 2018-03-08 DIAGNOSIS — S83412A Sprain of medial collateral ligament of left knee, initial encounter: Secondary | ICD-10-CM

## 2018-03-08 MED ORDER — OXYCODONE HCL 5 MG PO TABS: ORAL_TABLET | ORAL | 0 refills | Status: DC

## 2018-03-09 ENCOUNTER — Encounter (INDEPENDENT_AMBULATORY_CARE_PROVIDER_SITE_OTHER): Payer: Self-pay | Admitting: Orthopedic Surgery

## 2018-03-09 NOTE — Progress Notes (Signed)
   Post-Op Visit Note   Patient: Derrick Shields           Date of Birth: [birth year]           MRN: [id number] Visit Date: 03/08/2018 PCP: Patient, No Pcp Per   Assessment & Plan:  Chief Complaint:  Chief Complaint  Patient presents with  . Left Knee - Routine Post Op   Visit Diagnoses:  1. Complete tear of MCL of knee, left, initial encounter     Plan: Derrick Shields is a patient who underwent left knee deep and superficial MCL repair 02/26/2018.  Reports having a lot of pain.  Been out of oxycodone 3 days ago.  States he is not wearing his knee immobilizer.  He definitely wear the knee immobilizer.  States that today is the first day he has not used it.  He cannot really travel for the next 4 weeks.  He had some type of court date next 4 weeks but I do not think is a great idea for him to be walking around particularly if his compliance with that knee immobilizer is an issue.  He is taking aspirin for DVT prophylaxis.  No calf tenderness today on exam.  On exam the repair feels very stable.  He has good stability to valgus stress at 0 and 30 degrees.  Plan at this time is to continue aspirin a day refill oxycodone Ace wrap applied to the knee I do want him walking with that knee immobilizer but he can take the knee immobilizer off when he is at home.  Okay to weight-bear but only with a knee immobilizer in place.  I showed him the type of stress I want to avoid with him.  I am not sure how compliant he is going to be.  Fortunately today the repair feels solid.  Follow-up in 2-1/2 weeks.  Follow-Up Instructions: No Follow-up on file.   Orders:  No orders of the defined types were placed in this encounter.  Meds ordered this encounter  Medications  . oxyCODONE (ROXICODONE) 5 MG immediate release tablet    Sig: 1 po q 4-6hrs prn pain    Dispense:  42 tablet    Refill:  0    Imaging: No results found.  PMFS History: Patient Active Problem List   Diagnosis Date Noted  . Acute pain of left  knee 02/12/2018   Past Medical History:  Diagnosis Date  . GERD (gastroesophageal reflux disease)    OTC nexium as needed  . PONV (postoperative nausea and vomiting)     Family History  Problem Relation Age of Onset  . Diabetes Mother   . Hypertension Father     Past Surgical History:  Procedure Laterality Date  . jaw wired    . MEDIAL COLLATERAL LIGAMENT REPAIR, KNEE Left 02/26/2018   Procedure: LEFT KNEE OPEN MEDIAL COLLATERAL LIGAMENT (MCL) REPAIR;  Surgeon: Cammy Copaean, Mariellen Blaney Scott, MD;  [facility];  Service: Orthopedics;  Laterality: Left;   Social History   Occupational History  . Not on file  Tobacco Use  . Smoking status: Current Every Day Smoker    Packs/day: 1.00    Years: 1.00    Pack years: 1.00    Types: Cigarettes  . Smokeless tobacco: Never Used  Substance and Sexual Activity  . Alcohol use: No    Frequency: Never  . Drug use: No  . Sexual activity: Not on file

## 2018-03-27 ENCOUNTER — Ambulatory Visit (INDEPENDENT_AMBULATORY_CARE_PROVIDER_SITE_OTHER): Payer: Self-pay | Admitting: Orthopedic Surgery

## 2018-03-27 DIAGNOSIS — S83412A Sprain of medial collateral ligament of left knee, initial encounter: Secondary | ICD-10-CM

## 2018-03-29 ENCOUNTER — Encounter (INDEPENDENT_AMBULATORY_CARE_PROVIDER_SITE_OTHER): Payer: Self-pay | Admitting: Orthopedic Surgery

## 2018-03-29 NOTE — Progress Notes (Signed)
   Post-Op Visit Note   Patient: Derrick Shields           Date of Birth: 05-30-1991           MRN: [id number] Visit Date: 03/27/2018 PCP: Patient, No Pcp Per   Assessment & Plan:  Chief Complaint:  Chief Complaint  Patient presents with  . Left Knee - Pain, Follow-up   Visit Diagnoses: No diagnosis found.  Plan: Patient underwent left knee open MCL reconstruction and repair 1 month ago.  He has been doing well.  Taking oxycodone at night.  On examination he has no calf tenderness.  Knee range of motion is improving from full extension to about 90 of flexion.  The repair itself feels very solid to valgus stress at both 0 and 30 degrees.  Anterior posterior laxity is also improving.  It is a little bit more stable than when I saw him before.  No effusion in the knee.  Plan at this time is to change him over to a hinged knee brace with multiple stockinettes.  Come back in 4 weeks for clinical recheck.  Again jury is out on need for ACL PCL reconstruction at this time.  Follow-Up Instructions: Return in about 1 month (around 04/24/2018).   Orders:  No orders of the defined types were placed in this encounter.  No orders of the defined types were placed in this encounter.   Imaging: No results found.  PMFS History: Patient Active Problem List   Diagnosis Date Noted  . Acute pain of left knee 02/12/2018   Past Medical History:  Diagnosis Date  . GERD (gastroesophageal reflux disease)    OTC nexium as needed  . PONV (postoperative nausea and vomiting)     Family History  Problem Relation Age of Onset  . Diabetes Mother   . Hypertension Father     Past Surgical History:  Procedure Laterality Date  . jaw wired    . MEDIAL COLLATERAL LIGAMENT REPAIR, KNEE Left 02/26/2018   Procedure: LEFT KNEE OPEN MEDIAL COLLATERAL LIGAMENT (MCL) REPAIR;  Surgeon: Cammy Copaean, Ventura Hollenbeck Scott, MD;  [facility];  Service: Orthopedics;  Laterality: Left;   Social History   Occupational  History  . Not on file  Tobacco Use  . Smoking status: Current Every Day Smoker    Packs/day: 1.00    Years: 1.00    Pack years: 1.00    Types: Cigarettes  . Smokeless tobacco: Never Used  Substance and Sexual Activity  . Alcohol use: No    Frequency: Never  . Drug use: No  . Sexual activity: Not on file

## 2018-04-26 ENCOUNTER — Ambulatory Visit (INDEPENDENT_AMBULATORY_CARE_PROVIDER_SITE_OTHER): Payer: Self-pay | Admitting: Orthopedic Surgery
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
## 2022-05-24 ENCOUNTER — Emergency Department (HOSPITAL_COMMUNITY)
Admission: EM | Admit: 2022-05-24 | Discharge: 2022-05-25 | Discharge status: Against Medical Advice | Payer: Self-pay | Attending: Student | Admitting: Student

## 2022-05-24 ENCOUNTER — Other Ambulatory Visit: Payer: Self-pay

## 2022-05-24 DIAGNOSIS — Z5321 Procedure and treatment not carried out due to patient leaving prior to being seen by health care provider: Secondary | ICD-10-CM | POA: Insufficient documentation

## 2022-05-24 DIAGNOSIS — J029 Acute pharyngitis, unspecified: Secondary | ICD-10-CM | POA: Insufficient documentation

## 2022-05-24 DIAGNOSIS — R6883 Chills (without fever): Secondary | ICD-10-CM | POA: Insufficient documentation

## 2022-05-24 DIAGNOSIS — M791 Myalgia, unspecified site: Secondary | ICD-10-CM | POA: Insufficient documentation

## 2022-05-24 DIAGNOSIS — R0981 Nasal congestion: Secondary | ICD-10-CM | POA: Insufficient documentation

## 2022-05-24 LAB — GROUP A STREP BY PCR: Group A Strep by PCR: NOT DETECTED

## 2022-05-24 NOTE — ED Provider Triage Note (Signed)
  Emergency Medicine Provider Triage Evaluation Note  MRN:  [id number]  Arrival date & time: 05/24/22    Medically screening exam initiated at 10:21 PM.   CC:   Sore Throat   HPI:  Derrick Shields IV is a 31 y.o. year-old male presents to the ED with chief complaint of sore throat for the past 3 days.  Denies measured fever.  Reports associated body aches.  Took theraflu last night without much relief.  States his daughter had pink eye last week.  History provided by History provided by patient. ROS:  -As included in HPI PE:  There were no vitals filed for this visit.  Non-toxic appearing No respiratory distress Oropharynx is erythematous, tonsils are swollen with exudates MDM:  Based on signs and symptoms, tonsillitis is highest on my differential, followed by covid. I've ordered strep test in triage to expedite lab/diagnostic workup.  Patient was informed that the remainder of the evaluation will be completed by another provider, this initial triage assessment does not replace that evaluation, and the importance of remaining in the ED until their evaluation is complete.    Roxy Horseman, PA-C 05/24/22 2222

## 2022-05-24 NOTE — ED Triage Notes (Signed)
Pt here for generalized body aches and sore throat x2 days. Pt reports chills, congestion, aches & sore throat. Pt denies sick contacts but his daughter had pink eye 2 weeks ago.

## 2022-05-25 ENCOUNTER — Encounter (HOSPITAL_COMMUNITY): Payer: Self-pay

## 2022-05-25 ENCOUNTER — Ambulatory Visit (HOSPITAL_COMMUNITY)
Admission: EM | Admit: 2022-05-25 | Discharge: 2022-05-25 | Disposition: A | Discharge status: Home / Self Care | Payer: Self-pay | Attending: Emergency Medicine | Admitting: Emergency Medicine

## 2022-05-25 DIAGNOSIS — J029 Acute pharyngitis, unspecified: Secondary | ICD-10-CM | POA: Insufficient documentation

## 2022-05-25 LAB — POCT RAPID STREP A, ED / UC: Streptococcus, Group A Screen (Direct): NEGATIVE

## 2022-05-25 LAB — POCT INFECTIOUS MONO SCREEN, ED / UC: Mono Screen: NEGATIVE

## 2022-05-25 MED ORDER — ACETAMINOPHEN 325 MG PO TABS
975.0000 mg | ORAL_TABLET | Freq: Once | ORAL | Status: AC
  Administered 2022-05-25: 975 mg via ORAL

## 2022-05-25 MED ORDER — IBUPROFEN 800 MG PO TABS
800.0000 mg | ORAL_TABLET | Freq: Once | ORAL | Status: AC
  Administered 2022-05-25: 800 mg via ORAL

## 2022-05-25 MED ORDER — PROMETHAZINE-DM 6.25-15 MG/5ML PO SYRP: 5.0000 mL | ORAL_SOLUTION | Freq: Four times a day (QID) | ORAL | 0 refills | Status: AC | PRN

## 2022-05-25 MED ORDER — ACETAMINOPHEN 160 MG/5ML PO SOLN
ORAL | Status: AC
  Filled 2022-05-25: qty 40.6

## 2022-05-25 MED ORDER — DEXAMETHASONE SODIUM PHOSPHATE 10 MG/ML IJ SOLN
INTRAMUSCULAR | Status: AC
  Filled 2022-05-25: qty 1

## 2022-05-25 MED ORDER — ALBUTEROL SULFATE HFA 108 (90 BASE) MCG/ACT IN AERS: 1.0000 | INHALATION_SPRAY | RESPIRATORY_TRACT | 0 refills | Status: AC | PRN

## 2022-05-25 MED ORDER — AZITHROMYCIN 500 MG PO TABS: 500.0000 mg | ORAL_TABLET | Freq: Every day | ORAL | 0 refills | Status: AC

## 2022-05-25 MED ORDER — IBUPROFEN 100 MG/5ML PO SUSP
ORAL | Status: AC
  Filled 2022-05-25: qty 40

## 2022-05-25 MED ORDER — DEXAMETHASONE SODIUM PHOSPHATE 10 MG/ML IJ SOLN
10.0000 mg | Freq: Once | INTRAMUSCULAR | Status: AC
  Administered 2022-05-25: 10 mg via INTRAMUSCULAR

## 2022-05-25 MED ORDER — IBUPROFEN 600 MG PO TABS: 600.0000 mg | ORAL_TABLET | Freq: Four times a day (QID) | ORAL | 0 refills | Status: AC | PRN

## 2022-05-25 MED ORDER — AEROCHAMBER MV MISC: 1 refills | Status: AC

## 2022-05-25 NOTE — Discharge Instructions (Addendum)
Your mono is negative.  Your rapid strep was negative today, so we have sent off a throat culture.  I am starting you on antibiotics today, however, if the throat culture is negative, discontinue the antibiotics.  1000 mg of Tylenol and 600 mg ibuprofen together 3-4 times a day as needed for pain.  Make sure you drink plenty of extra fluids.  Some people find salt water gargles and  Traditional Medicinal's "Throat Coat" tea helpful. Take 5 mL of liquid Benadryl and 5 mL of Maalox. Mix it together, and then hold it in your mouth for as long as you can and then swallow. You may do this 4 times a day.  2 puffs from your albuterol inhaler using your spacer 3-4 times a day as needed for coughing, wheezing, Promethazine DM for cough.  You can try Flonase, saline nasal irrigation with a NeilMed sinus rinse and distilled water as often as you want for the nasal congestion.  Go to the ER for the signs and symptoms we discussed  Below is a list of primary care practices who are taking new patients for you to follow-up with.  [facility] -multiple locations.  See website at [email or web address]  [facility] internal medicine clinic Ground Floor - [facility], [address] [place], [place], [place] [address] [phone]  [facility] at [facility] [address] [place], [place] [address] [phone]  [facility] [address] [place], [place] [address] [phone]  [facility] [phone] [address] [place] [address]  [facility]: [address] [place] [address]  [phone]  [facility]: [address] [place] [address]  [phone]  [facility] [facility] : [address] [place] [address] [phone]  [facility] Primary Care: [address]  [place] [address] [phone]  [facility]: [address] [place] [address] [phone]  Dr. Oneal Grout [address] [facility] [place] [address]  [phone]  Go to www.goodrx.com  or www.costplusdrugs.com to look up your medications. This will give you a list of where you can find your prescriptions at the most affordable prices. Or ask the pharmacist what the cash price is, or if they have any other discount programs available to help make your medication more affordable. This can be less expensive than what you would pay with insurance.    Go to www.goodrx.com  or www.costplusdrugs.com to look up your medications. This will give you a list of where you can find your prescriptions at the most affordable prices. Or ask the pharmacist what the cash price is, or if they have any other discount programs available to help make your medication more affordable. This can be less expensive than what you would pay with insurance.

## 2022-05-25 NOTE — ED Provider Notes (Signed)
HPI  SUBJECTIVE:  Patient reports sore throat starting 4 to 5 days ago. Sx worse with swallowing.  Sx better with nothing. Has been taking Advil 200 mg and TheraFlu w/ o relief.  No fever + Swollen neck glands   No neck stiffness  + Cough-states is unable to sleep at night secondary to the cough.   +  nasal congestion, rhinorrhea, postnasal drip + Myalgias + Headache No Rash  No loss of taste or smell No shortness of breath or difficulty breathing No nausea, vomiting + diarrhea No abdominal pain     No Recent Strep, mono, COVID exposure Did not get the COVID-vaccine No reflux sxs No Allergy sxs  No Breathing difficulty, sensation of throat swelling shut + Raspy voice No Drooling No Trismus No abx in past month. No antipyretic in past 4-6 hrs  Patient is a smoker. Past medical history of GERD, anaphylaxis to penicillin.  No history of pulmonary disease, diabetes, hypertension PCP: None   Past Medical History:  Diagnosis Date   GERD (gastroesophageal reflux disease)    OTC nexium as needed   PONV (postoperative nausea and vomiting)     Past Surgical History:  Procedure Laterality Date   jaw wired     MEDIAL COLLATERAL LIGAMENT REPAIR, KNEE Left 02/26/2018   Procedure: LEFT KNEE OPEN MEDIAL COLLATERAL LIGAMENT (MCL) REPAIR;  Surgeon: Cammy Copa, MD;  Location: [facility] OR;  Service: Orthopedics;  Laterality: Left;    Family History  Problem Relation Age of Onset   Diabetes Mother    Hypertension Father     Social History   Tobacco Use   Smoking status: Every Day    Packs/day: 1.00    Years: 1.00    Pack years: 1.00    Types: Cigarettes   Smokeless tobacco: Never  Vaping Use   Vaping Use: Never used  Substance Use Topics   Alcohol use: No   Drug use: No    No current facility-administered medications for this encounter.  Current Outpatient Medications:    albuterol (VENTOLIN HFA) 108 (90 Base) MCG/ACT inhaler, Inhale 1-2 puffs into the lungs  every 4 (four) hours as needed for wheezing or shortness of breath., Disp: 1 each, Rfl: 0   azithromycin (ZITHROMAX) 500 MG tablet, Take 1 tablet (500 mg total) by mouth daily for 5 days., Disp: 5 tablet, Rfl: 0   ibuprofen (ADVIL) 600 MG tablet, Take 1 tablet (600 mg total) by mouth every 6 (six) hours as needed., Disp: 30 tablet, Rfl: 0   promethazine-dextromethorphan (PROMETHAZINE-DM) 6.25-15 MG/5ML syrup, Take 5 mLs by mouth 4 (four) times daily as needed for cough., Disp: 118 mL, Rfl: 0   Spacer/Aero-Holding Chambers (AEROCHAMBER MV) inhaler, Use as instructed, Disp: 1 each, Rfl: 1   esomeprazole (NEXIUM) 20 MG capsule, Take 20 mg by mouth as needed., Disp: , Rfl:   Allergies  Allergen Reactions   Penicillins Anaphylaxis    Has patient had a PCN reaction causing immediate rash, facial/tongue/throat swelling, SOB or lightheadedness with hypotension: Unknown Has patient had a PCN reaction causing severe rash involving mucus membranes or skin necrosis: Unknown Has patient had a PCN reaction that required hospitalization: Unknown Has patient had a PCN reaction occurring within the last 10 years: No If all of the above answers are "NO", then may proceed with Cephalosporin use.      ROS  As noted in HPI.   Physical Exam  BP (!) 147/84 (BP Location: Right Arm)   Pulse 100  Temp 98.6 F (37 C) (Oral)   Resp 18   SpO2 95%   Constitutional: Well developed, well nourished, no acute distress.  Coughing. Eyes:  EOMI, conjunctiva normal bilaterally HENT: Normocephalic, atraumatic,mucus membranes moist.  Positive nasal congestion.  No maxillary, sinus tenderness.  Enlarged, almost kissing intensely erythematous tonsils with extensive exudates.  No drooling, trismus, stridor, neck stiffness. Respiratory: Normal inspiratory effort.  Lungs clear bilaterally. Cardiovascular: Normal rate, no murmurs, rubs, gallops GI: nondistended, nontender. No appreciable splenomegaly skin: No rash, skin  intact Lymph: No anterior cervical lymphadenopathy.  Positive posterior cervical lymphadenopathy Musculoskeletal: no deformities Neurologic: Alert & oriented x 3, no focal neuro deficits Psychiatric: Speech and behavior appropriate.   ED Course   Medications  dexamethasone (DECADRON) injection 10 mg (10 mg Intramuscular Given 05/25/22 1834)  ibuprofen (ADVIL) tablet 800 mg (800 mg Oral Given 05/25/22 1834)  acetaminophen (TYLENOL) tablet 975 mg (975 mg Oral Given 05/25/22 1835)    Orders Placed This Encounter  Procedures   Culture, group A strep (throat)    Standing Status:   Standing    Number of Occurrences:   1   POCT Rapid Strep A (ED/UC)    Standing Status:   Standing    Number of Occurrences:   1   POCT Infectious Mono Screen    Standing Status:   Standing    Number of Occurrences:   1    Results for orders placed or performed during the hospital encounter of 05/25/22 (from the past 24 hour(s))  POCT Rapid Strep A (ED/UC)     Status: None   Collection Time: 05/25/22  6:00 PM  Result Value Ref Range   Streptococcus, Group A Screen (Direct) NEGATIVE NEGATIVE  POCT Infectious Mono Screen     Status: None   Collection Time: 05/25/22  6:38 PM  Result Value Ref Range   Mono Screen NEGATIVE NEGATIVE   No results found.  ED Clinical Impression  1. Exudative pharyngitis     ED Assessment/Plan  Mono, rapid strep, throat culture.  Dexamethasone 10 mg IM due to the extensive tonsillar swelling.  Tylenol/ibuprofen for pain.  However, there is no evidence of impending airway obstruction.  Rapid strep, mono negative.  Patient with an exudative tonsillitis/pharyngitis.  Patient home with ibuprofen, Tylenol, Benadryl/Maalox mixture, albuterol inhaler with a spacer, Promethazine DM, Flonase, saline nasal irrigation.  Will send home with azithromycin due to impressive physical exam, he will discontinue it if his throat culture comes back negative. Patient to followup with PCP of choice  when necessary, will refer to local primary care resources.  Discussed labs,  MDM, plan and followup with patient. Discussed sn/sx that should prompt return to the ED. patient agrees with plan.   Meds ordered this encounter  Medications   dexamethasone (DECADRON) injection 10 mg   ibuprofen (ADVIL) tablet 800 mg   acetaminophen (TYLENOL) tablet 975 mg   azithromycin (ZITHROMAX) 500 MG tablet    Sig: Take 1 tablet (500 mg total) by mouth daily for 5 days.    Dispense:  5 tablet    Refill:  0   ibuprofen (ADVIL) 600 MG tablet    Sig: Take 1 tablet (600 mg total) by mouth every 6 (six) hours as needed.    Dispense:  30 tablet    Refill:  0   albuterol (VENTOLIN HFA) 108 (90 Base) MCG/ACT inhaler    Sig: Inhale 1-2 puffs into the lungs every 4 (four) hours as needed for wheezing or shortness  of breath.    Dispense:  1 each    Refill:  0   Spacer/Aero-Holding Chambers (AEROCHAMBER MV) inhaler    Sig: Use as instructed    Dispense:  1 each    Refill:  1   promethazine-dextromethorphan (PROMETHAZINE-DM) 6.25-15 MG/5ML syrup    Sig: Take 5 mLs by mouth 4 (four) times daily as needed for cough.    Dispense:  118 mL    Refill:  0     *This clinic note was created using Scientist, clinical (histocompatibility and immunogenetics). Therefore, there may be occasional mistakes despite careful proofreading.     Domenick Gong, MD 05/25/22 [phone]

## 2022-05-25 NOTE — ED Triage Notes (Signed)
Pt presents with generalized body aches, sore throat, and headache X 4 days.

## 2022-05-28 LAB — CULTURE, GROUP A STREP (THRC)

## 2022-06-12 ENCOUNTER — Ambulatory Visit (HOSPITAL_COMMUNITY)
Admission: EM | Admit: 2022-06-12 | Discharge: 2022-06-12 | Disposition: A | Discharge status: Home / Self Care | Payer: Self-pay | Attending: Internal Medicine | Admitting: Internal Medicine

## 2022-06-12 ENCOUNTER — Other Ambulatory Visit: Payer: Self-pay

## 2022-06-12 ENCOUNTER — Encounter (HOSPITAL_COMMUNITY): Payer: Self-pay | Admitting: Emergency Medicine

## 2022-06-12 DIAGNOSIS — J020 Streptococcal pharyngitis: Secondary | ICD-10-CM

## 2022-06-12 DIAGNOSIS — R0981 Nasal congestion: Secondary | ICD-10-CM

## 2022-06-12 DIAGNOSIS — Z113 Encounter for screening for infections with a predominantly sexual mode of transmission: Secondary | ICD-10-CM

## 2022-06-12 LAB — POCT RAPID STREP A, ED / UC: Streptococcus, Group A Screen (Direct): POSITIVE — AB

## 2022-06-12 LAB — HIV ANTIBODY (ROUTINE TESTING W REFLEX): HIV Screen 4th Generation wRfx: NONREACTIVE

## 2022-06-12 MED ORDER — IBUPROFEN 800 MG PO TABS
ORAL_TABLET | ORAL | Status: AC
  Filled 2022-06-12: qty 1

## 2022-06-12 MED ORDER — IBUPROFEN 800 MG PO TABS
800.0000 mg | ORAL_TABLET | Freq: Once | ORAL | Status: AC
  Administered 2022-06-12: 800 mg via ORAL

## 2022-06-12 MED ORDER — ACETAMINOPHEN 500 MG PO TABS: 1000.0000 mg | ORAL_TABLET | Freq: Four times a day (QID) | ORAL | 0 refills | Status: AC | PRN

## 2022-06-12 MED ORDER — FLUTICASONE PROPIONATE 50 MCG/ACT NA SUSP: 2.0000 | Freq: Every day | NASAL | 2 refills | Status: AC

## 2022-06-12 MED ORDER — BENZONATATE 100 MG PO CAPS: 100.0000 mg | ORAL_CAPSULE | Freq: Three times a day (TID) | ORAL | 0 refills | Status: AC

## 2022-06-12 MED ORDER — GUAIFENESIN ER 1200 MG PO TB12: 1200.0000 mg | ORAL_TABLET | Freq: Two times a day (BID) | ORAL | 0 refills | Status: AC

## 2022-06-12 MED ORDER — CLINDAMYCIN HCL 300 MG PO CAPS: 300.0000 mg | ORAL_CAPSULE | Freq: Three times a day (TID) | ORAL | 0 refills | Status: AC

## 2022-06-12 NOTE — ED Provider Notes (Signed)
[facility]    CSN: [id number] Arrival date & time: 06/12/22  1137      History   Chief Complaint Chief Complaint  Patient presents with   Cough    HPI Derrick Shields is a 31 y.o. male.   Patient presents urgent care for evaluation of cough, sore throat, and nasal congestion for the last 18 days.  He was seen on May 25, 2022 for same symptoms and diagnosed with exudative pharyngitis and given prescription for azithromycin, albuterol, ibuprofen, and Promethazine DM for symptoms.  Patient has taken all of the Promethazine DM cough syrup to help with his cough at night.  He is also taken all of the azithromycin antibiotic.  Symptoms improved after about a week of taking medication, but returned 5 to 6 days ago.  Patient is complaining of 10 on a scale 0-10 throat pain with coughing and nasal congestion.  He has been taking ibuprofen at home with temporary relief of pain.  Also reporting chills at home without recorded temperature due to lack of thermometer at home.  Denies abdominal pain, nausea, vomiting, wheezing, shortness of breath, chest pain, dizziness, ear pain, and back pain.  No sick contacts reported.   He would also like STI testing today as he recently had sexual intercourse with a new partner whom he later found out has genital herpes.  No penile discharge, itch, rash, or urinary symptoms present today.  Denies condom use.   Cough   Past Medical History:  Diagnosis Date   GERD (gastroesophageal reflux disease)    OTC nexium as needed   PONV (postoperative nausea and vomiting)     Patient Active Problem List   Diagnosis Date Noted   Acute pain of left knee 02/12/2018    Past Surgical History:  Procedure Laterality Date   jaw wired     MEDIAL COLLATERAL LIGAMENT REPAIR, KNEE Left 02/26/2018   Procedure: LEFT KNEE OPEN MEDIAL COLLATERAL LIGAMENT (MCL) REPAIR;  Surgeon: Cammy Copa, MD;  Location: [facility] OR;  Service: Orthopedics;  Laterality:  Left;       Home Medications    Prior to Admission medications   Medication Sig Start Date End Date Taking? Authorizing Provider  acetaminophen (TYLENOL) 500 MG tablet Take 2 tablets (1,000 mg total) by mouth every 6 (six) hours as needed. 06/12/22  Yes Carlisle Beers, FNP  benzonatate (TESSALON) 100 MG capsule Take 1 capsule (100 mg total) by mouth every 8 (eight) hours. 06/12/22  Yes Carlisle Beers, FNP  clindamycin (CLEOCIN) 300 MG capsule Take 1 capsule (300 mg total) by mouth 3 (three) times daily for 10 days. 06/12/22 06/22/22 Yes Lamaj Metoyer, Donavan Burnet, FNP  fluticasone (FLONASE) 50 MCG/ACT nasal spray Place 2 sprays into both nostrils daily. 06/12/22  Yes Carlisle Beers, FNP  Guaifenesin 1200 MG TB12 Take 1 tablet (1,200 mg total) by mouth in the morning and at bedtime. 06/12/22  Yes Carlisle Beers, FNP  albuterol (VENTOLIN HFA) 108 (90 Base) MCG/ACT inhaler Inhale 1-2 puffs into the lungs every 4 (four) hours as needed for wheezing or shortness of breath. 05/25/22   Domenick Gong, MD  esomeprazole (NEXIUM) 20 MG capsule Take 20 mg by mouth as needed. Patient not taking: Reported on 06/12/2022    [provider]  ibuprofen (ADVIL) 600 MG tablet Take 1 tablet (600 mg total) by mouth every 6 (six) hours as needed. 05/25/22   Domenick Gong, MD  promethazine-dextromethorphan (PROMETHAZINE-DM) 6.25-15 MG/5ML syrup Take  5 mLs by mouth 4 (four) times daily as needed for cough. Patient not taking: Reported on 06/12/2022 05/25/22   Domenick Gong, MD  Spacer/Aero-Holding Chambers (AEROCHAMBER MV) inhaler Use as instructed 05/25/22   Domenick Gong, MD    Family History Family History  Problem Relation Age of Onset   Diabetes Mother    Hypertension Father     Social History Social History   Tobacco Use   Smoking status: Every Day    Packs/day: 1.00    Years: 1.00    Total pack years: 1.00    Types: Cigarettes   Smokeless tobacco: Never  Vaping  Use   Vaping Use: Never used  Substance Use Topics   Alcohol use: Yes   Drug use: No     Allergies   Penicillins   Review of Systems Review of Systems  Respiratory:  Positive for cough.      Physical Exam Triage Vital Signs ED Triage Vitals  Enc Vitals Group     BP 06/12/22 1316 (!) 143/86     Pulse Rate 06/12/22 1316 (!) 110     Resp 06/12/22 1316 20     Temp 06/12/22 1316 98.3 F (36.8 C)     Temp Source 06/12/22 1316 Oral     SpO2 06/12/22 1316 96 %     Weight --      Height --      Head Circumference --      Peak Flow --      Pain Score 06/12/22 1314 10     Pain Loc --      Pain Edu? --      Excl. in GC? --    No data found.  Updated Vital Signs BP (!) 143/86 (BP Location: Left Arm) Comment (BP Location): large cuff  Pulse (!) 110   Temp 98.3 F (36.8 C) (Oral)   Resp 20 Comment: coughing frequently  SpO2 96%   Visual Acuity Right Eye Distance:   Left Eye Distance:   Bilateral Distance:    Right Eye Near:   Left Eye Near:    Bilateral Near:     Physical Exam Vitals and nursing note reviewed.  Constitutional:      Appearance: Normal appearance. He is not ill-appearing or toxic-appearing.     Comments: Very pleasant patient sitting on exam in position of comfort table in no acute distress.   HENT:     Head: Normocephalic and atraumatic.     Right Ear: Hearing, tympanic membrane, ear canal and external ear normal.     Left Ear: Hearing, tympanic membrane, ear canal and external ear normal.     Nose: Congestion and rhinorrhea present.     Right Turbinates: Swollen and pale.     Left Turbinates: Swollen and pale.     Right Sinus: No maxillary sinus tenderness or frontal sinus tenderness.     Left Sinus: No maxillary sinus tenderness or frontal sinus tenderness.     Mouth/Throat:     Lips: Pink.     Mouth: Mucous membranes are moist.     Dentition: Normal dentition.     Pharynx: Uvula midline. Oropharyngeal exudate and posterior oropharyngeal  erythema present.     Tonsils: No tonsillar abscesses. 2+ on the right. 2+ on the left.     Comments: Posterior oropharynx significantly erythematous and swollen.  Airway is intact. Eyes:     General: Lids are normal. Vision grossly intact. Gaze aligned appropriately.     Extraocular  Movements: Extraocular movements intact.     Conjunctiva/sclera: Conjunctivae normal.  Cardiovascular:     Rate and Rhythm: Normal rate and regular rhythm.     Heart sounds: Normal heart sounds, S1 normal and S2 normal.  Pulmonary:     Effort: Pulmonary effort is normal. No respiratory distress.     Breath sounds: Normal breath sounds and air entry.  Abdominal:     General: Bowel sounds are normal.     Palpations: Abdomen is soft.     Tenderness: There is no abdominal tenderness. There is no right CVA tenderness, left CVA tenderness or guarding.  Musculoskeletal:     Cervical back: Full passive range of motion without pain and neck supple.  Lymphadenopathy:     Cervical: Cervical adenopathy present.  Skin:    General: Skin is warm and dry.     Capillary Refill: Capillary refill takes less than 2 seconds.     Findings: No rash.  Neurological:     General: No focal deficit present.     Mental Status: He is alert and oriented to person, place, and time. Mental status is at baseline.     Cranial Nerves: No dysarthria or facial asymmetry.     Motor: No weakness.     Gait: Gait is intact. Gait normal.  Psychiatric:        Mood and Affect: Mood normal.        Speech: Speech normal.        Behavior: Behavior normal.        Thought Content: Thought content normal.        Judgment: Judgment normal.      UC Treatments / Results  Labs (all labs ordered are listed, but only abnormal results are displayed) Labs Reviewed  POCT RAPID STREP A, ED / UC - Abnormal; Notable for the following components:      Result Value   Streptococcus, Group A Screen (Direct) POSITIVE (*)    All other components within  normal limits  HIV ANTIBODY (ROUTINE TESTING W REFLEX)  RPR  CYTOLOGY, (ORAL, ANAL, URETHRAL) ANCILLARY ONLY    EKG   Radiology No results found.  Procedures Procedures (including critical care time)  Medications Ordered in UC Medications  ibuprofen (ADVIL) tablet 800 mg (has no administration in time range)    Initial Impression / Assessment and Plan / UC Course  I have reviewed the triage vital signs and the nursing notes.  Pertinent labs & imaging results that were available during my care of the patient were reviewed by me and considered in my medical decision making (see chart for details).  Streptococcal pharyngitis Group A strep testing positive in clinic today.  Patient recently took azithromycin course of antibiotics in full.  Plan to treat with clindamycin every 8 hours for the next 10 days as patient has a penicillin allergy.  Ibuprofen given for throat pain in clinic. Patient to continue taking ibuprofen and Tylenol every 6 hours as needed at home for fever/chills and pain/inflammation.  Next dose of ibuprofen may be tonight at 11 PM since he was given some in the clinic today.  He may use salt water gargles for throat pain at home as well.  Cough No adventitious lung sounds heard to cardiopulmonary exam.  Deferred imaging based on stable cardiopulmonary exam and vital signs.  Plan to treat with Jerilynn Som as I believe cough is likely related to postnasal drainage due to congestion and group A streptococcal pharyngitis.   Nasal  congestion Flonase 2 puffs into each nostril daily prescribed.  Patient to take guaifenesin every 12 hours to thin mucus so that he is able to cough it up and blow out of his nose easier.  Increase water intake to at least 64 ounces of water per day.  Encounter for screening examination of sexually transmitted diseases STI testing pending.  Cytology and HIV/RPR collected today.  We will call patient with positive results.  Patient advised to  use condoms to prevent spread of STIs.  He is to return to urgent care if he develops a rash for genital herpes infection.  Discussed lack of screening tools for genital herpes.  Patient verbalizes understanding and agrees with plan.  Counseled patient regarding appropriate use of medications and potential side effects for all medications recommended or prescribed today. Discussed red flag signs and symptoms of worsening condition,when to call the PCP office, return to urgent care, and when to seek higher level of care. Patient verbalizes understanding and agreement with plan. All questions answered. Patient discharged in stable condition.  Final Clinical Impressions(s) / UC Diagnoses   Final diagnoses:  Strep pharyngitis  Nasal congestion  Encounter for screening examination for sexually transmitted disease     Discharge Instructions      Your strep test was positive.  Take clindamycin every 8 hours for the next 10 days with food to treat positive group A strep test.   Take Tessalon Perles every 8 hours as needed for cough.  This medication will not make you sleepy, so you may take it during the day as well.  Use 2 sprays of Flonase into each nostril daily for nasal swelling and inflammation.  Take guaifenesin 1200 mg every 12 hours to thin mucus so that you are able to cough it up and blow it out of your nose easier.  You may take Tylenol 1000 mg every 6 hours as needed for pain and fever.  You may take ibuprofen every 6 hours as needed for pain.  No ibuprofen until 11 PM tonight since you were given some the clinic today.  Work note at end of packet.  If you develop any new or worsening symptoms or do not improve in the next 2 to 3 days, please return.  If your symptoms are severe, please go to the emergency room.  Follow-up with your primary care provider for further evaluation and management of your symptoms as well as ongoing wellness visits.  I hope you feel better!     ED  Prescriptions     Medication Sig Dispense Auth. Provider   clindamycin (CLEOCIN) 300 MG capsule Take 1 capsule (300 mg total) by mouth 3 (three) times daily for 10 days. 30 capsule Reita May M, FNP   acetaminophen (TYLENOL) 500 MG tablet Take 2 tablets (1,000 mg total) by mouth every 6 (six) hours as needed. 30 tablet Reita May M, FNP   fluticasone [facility]) 50 MCG/ACT nasal spray Place 2 sprays into both nostrils daily. 16 g Reita May M, FNP   Guaifenesin 1200 MG TB12 Take 1 tablet (1,200 mg total) by mouth in the morning and at bedtime. 14 tablet Reita May M, FNP   benzonatate (TESSALON) 100 MG capsule Take 1 capsule (100 mg total) by mouth every 8 (eight) hours. 21 capsule Carlisle Beers, FNP      PDMP not reviewed this encounter.   Carlisle Beers, [place] 06/12/22 1450

## 2022-06-12 NOTE — ED Triage Notes (Addendum)
Seen on 6/1 for the same.  Thought he was getting better, but symptoms are worsening again.  Patient cough, sore throat, patient was coughing so much it resulted in vomiting.    Patient asked if we test for sti.  When asked what symptoms he has , he spoke of abscess.  Reports he has had abscess since child hood.  Patient made reference to messages he has received.  Patient denies penile discharge

## 2022-06-12 NOTE — Discharge Instructions (Addendum)
Your strep test was positive.  Take clindamycin every 8 hours for the next 10 days with food to treat positive group A strep test.   Take Tessalon Perles every 8 hours as needed for cough.  This medication will not make you sleepy, so you may take it during the day as well.  Use 2 sprays of Flonase into each nostril daily for nasal swelling and inflammation.  Take guaifenesin 1200 mg every 12 hours to thin mucus so that you are able to cough it up and blow it out of your nose easier.  You may take Tylenol 1000 mg every 6 hours as needed for pain and fever.  You may take ibuprofen every 6 hours as needed for pain.  No ibuprofen until 11 PM tonight since you were given some the clinic today.  Work note at end of packet.  If you develop any new or worsening symptoms or do not improve in the next 2 to 3 days, please return.  If your symptoms are severe, please go to the emergency room.  Follow-up with your primary care provider for further evaluation and management of your symptoms as well as ongoing wellness visits.  I hope you feel better!

## 2022-06-13 LAB — RPR: RPR Ser Ql: NONREACTIVE

## 2022-06-13 LAB — CYTOLOGY, (ORAL, ANAL, URETHRAL) ANCILLARY ONLY
Chlamydia: NEGATIVE
Comment: NEGATIVE
Comment: NEGATIVE
Comment: NORMAL
Neisseria Gonorrhea: NEGATIVE
Trichomonas: NEGATIVE
